# Patient Record
Sex: Male | Born: 1985
Health system: Southern US, Community
[De-identification: ages and names within clinical notes are randomized; demographics above are authoritative.]

## PROBLEM LIST (undated history)

## (undated) DIAGNOSIS — F32A Depression, unspecified: Secondary | ICD-10-CM

## (undated) DIAGNOSIS — T79A0XA Compartment syndrome, unspecified, initial encounter: Secondary | ICD-10-CM

## (undated) DIAGNOSIS — M9260 Juvenile osteochondrosis of tarsus, unspecified ankle: Secondary | ICD-10-CM

## (undated) DIAGNOSIS — L709 Acne, unspecified: Secondary | ICD-10-CM

## (undated) DIAGNOSIS — F329 Major depressive disorder, single episode, unspecified: Secondary | ICD-10-CM

## (undated) DIAGNOSIS — E039 Hypothyroidism, unspecified: Secondary | ICD-10-CM

## (undated) DIAGNOSIS — R369 Urethral discharge, unspecified: Secondary | ICD-10-CM

## (undated) HISTORY — DX: Acne, unspecified: L70.9

## (undated) HISTORY — DX: Compartment syndrome, unspecified, initial encounter: T79.A0XA

## (undated) HISTORY — PX: OTHER SURGICAL HISTORY: SHX169

## (undated) HISTORY — DX: Juvenile osteochondrosis of tarsus, unspecified ankle: M92.60

## (undated) HISTORY — PX: GROWTH PLATE SURGERY: SHX657

---
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
## 2006-01-03 ENCOUNTER — Ambulatory Visit: Payer: Self-pay | Admitting: Internal Medicine

## 2006-01-10 ENCOUNTER — Ambulatory Visit: Payer: Self-pay | Admitting: Internal Medicine

## 2007-02-11 DIAGNOSIS — J309 Allergic rhinitis, unspecified: Secondary | ICD-10-CM | POA: Insufficient documentation

## 2007-06-06 ENCOUNTER — Telehealth: Payer: Self-pay | Admitting: Internal Medicine

## 2007-12-31 ENCOUNTER — Encounter: Admission: RE | Admit: 2007-12-31 | Discharge: 2007-12-31 | Payer: Self-pay | Admitting: Sports Medicine

## 2008-01-06 ENCOUNTER — Encounter: Payer: Self-pay | Admitting: Internal Medicine

## 2008-01-07 ENCOUNTER — Ambulatory Visit (HOSPITAL_BASED_OUTPATIENT_CLINIC_OR_DEPARTMENT_OTHER): Admission: RE | Admit: 2008-01-07 | Discharge: 2008-01-07 | Payer: Self-pay | Admitting: Orthopedic Surgery

## 2008-01-20 ENCOUNTER — Encounter: Payer: Self-pay | Admitting: Internal Medicine

## 2008-01-26 ENCOUNTER — Emergency Department (HOSPITAL_COMMUNITY): Admission: EM | Admit: 2008-01-26 | Discharge: 2008-01-26 | Payer: Self-pay | Admitting: Emergency Medicine

## 2008-03-26 ENCOUNTER — Telehealth: Payer: Self-pay | Admitting: Internal Medicine

## 2008-03-27 ENCOUNTER — Ambulatory Visit: Payer: Self-pay | Admitting: Internal Medicine

## 2008-03-27 DIAGNOSIS — L708 Other acne: Secondary | ICD-10-CM

## 2008-03-27 DIAGNOSIS — J069 Acute upper respiratory infection, unspecified: Secondary | ICD-10-CM | POA: Insufficient documentation

## 2008-06-15 ENCOUNTER — Telehealth: Payer: Self-pay | Admitting: Internal Medicine

## 2008-06-26 DIAGNOSIS — T79A0XA Compartment syndrome, unspecified, initial encounter: Secondary | ICD-10-CM

## 2008-06-26 HISTORY — PX: OTHER SURGICAL HISTORY: SHX169

## 2008-06-26 HISTORY — DX: Compartment syndrome, unspecified, initial encounter: T79.A0XA

## 2008-07-06 ENCOUNTER — Telehealth: Payer: Self-pay | Admitting: Internal Medicine

## 2008-07-13 ENCOUNTER — Ambulatory Visit: Payer: Self-pay | Admitting: Internal Medicine

## 2008-09-28 ENCOUNTER — Telehealth: Payer: Self-pay | Admitting: *Deleted

## 2008-11-18 ENCOUNTER — Ambulatory Visit: Payer: Self-pay | Admitting: Internal Medicine

## 2008-11-18 DIAGNOSIS — R634 Abnormal weight loss: Secondary | ICD-10-CM | POA: Insufficient documentation

## 2008-11-20 ENCOUNTER — Encounter: Payer: Self-pay | Admitting: Internal Medicine

## 2008-11-26 ENCOUNTER — Telehealth: Payer: Self-pay | Admitting: Internal Medicine

## 2008-11-27 ENCOUNTER — Ambulatory Visit: Payer: Self-pay | Admitting: Internal Medicine

## 2008-11-27 LAB — CONVERTED CEMR LAB: Thyroperoxidase Ab SerPl-aCnc: 39.5 (ref 0.0–60.0)

## 2008-12-02 LAB — CONVERTED CEMR LAB
Free T4: 0.9 ng/dL (ref 0.6–1.6)
T3, Free: 2.8 pg/mL (ref 2.3–4.2)
TSH: 5.24 microintl units/mL (ref 0.35–5.50)

## 2009-06-26 HISTORY — PX: OTHER SURGICAL HISTORY: SHX169

## 2009-07-12 ENCOUNTER — Ambulatory Visit: Payer: Self-pay | Admitting: Internal Medicine

## 2009-07-12 DIAGNOSIS — L259 Unspecified contact dermatitis, unspecified cause: Secondary | ICD-10-CM | POA: Insufficient documentation

## 2009-09-01 ENCOUNTER — Ambulatory Visit: Payer: Self-pay | Admitting: Internal Medicine

## 2010-05-24 ENCOUNTER — Ambulatory Visit: Payer: Self-pay | Admitting: Internal Medicine

## 2010-05-24 DIAGNOSIS — F1021 Alcohol dependence, in remission: Secondary | ICD-10-CM

## 2010-05-24 DIAGNOSIS — F4320 Adjustment disorder, unspecified: Secondary | ICD-10-CM | POA: Insufficient documentation

## 2010-07-26 NOTE — Assessment & Plan Note (Signed)
Summary: acne//ccm rsc bmp/njr   Vital Signs:  Patient profile:   25 year old male Height:      73 inches Weight:      217 pounds BMI:     28.73 Pulse rate:   78 / minute BP sitting:   120 / 80  (right arm) Cuff size:   regular  Vitals Entered By: Romualdo Bolk, CMA (AAMA) (September 01, 2009 1:52 PM) CC: Discuss acne on back and around his mounth.    History of Present Illness: Patrick Hamilton comes in at spring break   for   acne . he states he is not concerned about other problems . Denies depression major injury  or major injury.   Acjne is on chin separate that other rash and some on upper back.     ? topicals in past ; No oral antibiotic for a while   Preventive Screening-Counseling & Management  Alcohol-Tobacco     Alcohol drinks/day: <1     Alcohol type: all     Feels need to cut down: no     Feels annoyed by complaints: no     Feels guilty re: drinking: no     Needs 'eye opener' in am: no     Smoking Status: never     Passive Smoke Exposure: yes  Caffeine-Diet-Exercise     Caffeine use/day: 0     Does Patient Exercise: yes     Type of exercise: wt's and cardio  Comments: past hx of  black out with alcohol in the past.    Hep-HIV-STD-Contraception     Dental Visit-last 6 months yes     Sun Exposure-Excessive: no  Safety-Violence-Falls     Seat Belt Use: yes     Firearms in the Home: no firearms in the home  Current Medications (verified): 1)  Fexofenadine Hcl 180 Mg Tabs (Fexofenadine Hcl) .Marland Kitchen.. 1 By Mouth Once Daily 2)  Desonide 0.05 % Oint (Desonide) .... Apply Two Times A Day As Needed  Allergies (verified): No Known Drug Allergies  Past History:  Past medical, surgical, family and social histories (including risk factors) reviewed, and no changes noted (except as noted below).  Past Medical History: Reviewed history from 03/27/2008 and no changes required. BW 7#6 oz  no neonatal problems Acne Allergic rhinitis Seviers disease  Past Surgical  History: Reviewed history from 03/27/2008 and no changes required. Nose Surgery Fx growth plate - R foot  Past History:  Care Management: Urgent Care in [place], [place] for Wrist  Family History: Reviewed history from 03/27/2008 and no changes required. Family History Diabetes 1st degree relative Family History of Learning Problems bro with autism Family History of Allergies  alcohol problems on moms side of family  Social History: Reviewed history from 07/12/2009 and no changes required. Single student  at [employer].    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   But has a meal plan.   1-2 meals per day.  sleep 7  ok.  Sun Exposure-Excessive:  no  Review of Systems  The patient denies anorexia, fever, weight loss, weight gain, vision loss, decreased hearing, hoarseness, chest pain, syncope, dyspnea on exertion, peripheral edema, prolonged cough, headaches, hemoptysis, abdominal pain, melena, hematochezia, severe indigestion/heartburn, hematuria, muscle weakness, difficulty walking, depression, unusual weight change, abnormal bleeding, enlarged lymph nodes, and angioedema.         sprained ankle     basket ball.     Physical Exam  General:  Well-developed,well-nourished,in  no acute distress; alert,appropriate and cooperative throughout examination Head:  normocephalic and atraumatic.   Eyes:  vision grossly intact.   Ears:  R ear normal, L ear normal, and no external deformities.   Nose:  no external deformity, no external erythema, and no nasal discharge.   Mouth:  pharynx pink and moist.  teeth in good repair Neck:  No deformities, masses, or tenderness noted. Lungs:  Normal respiratory effort, chest expands symmetrically. Lungs are clear to auscultation, no crackles or wheezes. Heart:  Normal rate and regular rhythm. S1 and S2 normal without gallop, murmur, click, rub or other extra sounds.no lifts.   Abdomen:  Bowel sounds positive,abdomen soft and non-tender without masses,  organomegaly or   noted. Msk:  no joint swelling, no joint warmth, and no redness over joints.   Pulses:  R and L carotid,radial,femoral,dorsalis pedis and posterior tibial pulses are full and equal bilaterally Extremities:  no clubbing cyanosis or edema  Neurologic:  alert & oriented X3, strength normal in all extremities, and gait normal.   Skin:  intact with   cystic acne      upper back  papules on lower face  mid face i sgood.  Cervical Nodes:  No lymphadenopathy noted Psych:  Oriented X3, good eye contact, not anxious appearing, and not depressed appearing.     Impression & Recommendations:  Problem # 1:  ACNE VULGARIS (ICD-706.1)  His updated medication list for this problem includes:    Doxycycline Hyclate 100 Mg Caps (Doxycycline hyclate) .Marland Kitchen... 1 by mouth two times a day for acne or as directed  Problem # 2:  counseling disc   counseled healthy lifestyle and minimizing alcohol risk.   Complete Medication List: 1)  Fexofenadine Hcl 180 Mg Tabs (Fexofenadine hcl) .Marland Kitchen.. 1 by mouth once daily 2)  Desonide 0.05 % Oint (Desonide) .... Apply two times a day as needed 3)  Doxycycline Hyclate 100 Mg Caps (Doxycycline hyclate) .Marland Kitchen.. 1 by mouth two times a day for acne or as directed  Patient Instructions: 1)  take the doxycycline  100 by mouth two times a day for at least a month to see how  helps the acne   2)  them may need to change .  to a topical .  3)  rov in 4-8 weeks or as needed.  Prescriptions: DOXYCYCLINE HYCLATE 100 MG CAPS (DOXYCYCLINE HYCLATE) 1 by mouth two times a day for acne or as directed  #60 x 2   Entered and Authorized by:   Madelin Headings MD   Signed by:   Madelin Headings MD on 09/01/2009   Method used:   Electronically to        [facility] [address] [phone]* (retail)       [address]       [place], [place]  [address]       Ph: [phone]       Fax: [phone]   RxID:   [id number]

## 2010-07-26 NOTE — Assessment & Plan Note (Signed)
Summary: rash on face and neck/cjr/pts mom rsc/cjr   Vital Signs:  Patient profile:   25 year old male Weight:      217 pounds Pulse rate:   78 / minute BP sitting:   110 / 70  (right arm) Cuff size:   large  Vitals Entered By: Romualdo Bolk, CMA (AAMA) (July 12, 2009 2:03 PM) CC: Rashes all over face and neck x 1 week- Pt states that he was taken benadryl  at night and using desonide ointment 0.05% bid as well whiched help. Pt states that the part around his eye itched real bad.   History of Present Illness: Patrick Hamilton comes in today because he had a facial rash as above.  He used GMs left over ointment  Using  two times a day desonide     on face for a rash around eye   and face minimal itching.   no internal eye signs .    Last used  yesterday.  No other treatment.   Much better now but would like rx.  ONly used for aweek but going back to campus. OTc med previously.   / if contact.  Allergy:  Needs refill  meds helping.  No cp sob.  Preventive Screening-Counseling & Management  Alcohol-Tobacco     Alcohol drinks/day: <1     Alcohol type: all     Smoking Status: never     Passive Smoke Exposure: yes  Caffeine-Diet-Exercise     Caffeine use/day: 0     Does Patient Exercise: yes     Type of exercise: wt's and cardio  Current Medications (verified): 1)  Fexofenadine Hcl 180 Mg Tabs (Fexofenadine Hcl) .Marland Kitchen.. 1 By Mouth Once Daily  Allergies (verified): No Known Drug Allergies  Past History:  Past medical, surgical, family and social histories (including risk factors) reviewed, and no changes noted (except as noted below).  Past Medical History: Reviewed history from 03/27/2008 and no changes required. BW 7#6 oz  no neonatal problems Acne Allergic rhinitis Seviers disease  Past Surgical History: Reviewed history from 03/27/2008 and no changes required. Nose Surgery Fx growth plate - R foot  Family History: Reviewed history from 03/27/2008 and no changes  required. Family History Diabetes 1st degree relative Family History of Learning Problems bro with autism Family History of Allergies  Social History: Reviewed history from 11/18/2008 and no changes required. Single student  at [employer].    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   But has a meal plan.   1-2 meals per day.   Review of Systems  The patient denies anorexia, fever, chest pain, syncope, dyspnea on exertion, prolonged cough, abdominal pain, abnormal bleeding, enlarged lymph nodes, and angioedema.    Physical Exam  General:  Well-developed,well-nourished,in no acute distress; alert,appropriate and cooperative throughout examination Head:  normocephalic and atraumatic.   Eyes:  vision grossly intact, pupils equal, and pupils round.   no redness  Neck:  No deformities, masses, or tenderness noted. Lungs:  Normal respiratory effort, chest expands symmetrically. Lungs are clear to auscultation, no crackles or wheezes. Heart:  Normal rate and regular rhythm. S1 and S2 normal without gallop, murmur, click, rub or other extra sounds. Pulses:  pulses intact without delay   Skin:  turgor normal, color normal, no ecchymoses, and no petechiae.   minimal acne on chin and around mouth.    Cervical Nodes:  No lymphadenopathy noted   Impression & Recommendations:  Problem # 1:  DERMATITIS (  ICD-692.9)  poss  contact .Marland Kitchen.. currently better.      Cautioned about steroid use on face and avoid rebound.   His updated medication list for this problem includes:    Fexofenadine Hcl 180 Mg Tabs (Fexofenadine hcl) .Marland Kitchen... 1 by mouth once daily    Desonide 0.05 % Oint (Desonide) .Marland Kitchen... Apply two times a day as needed  Orders: Prescription Created Electronically [phone])  Problem # 2:  ALLERGIC RHINITIS (ICD-477.9) Assessment: Unchanged  His updated medication list for this problem includes:    Fexofenadine Hcl 180 Mg Tabs (Fexofenadine hcl) .Marland Kitchen... 1 by mouth once daily  Discussed use of  allergy medications and environmental measures.   Orders: Prescription Created Electronically [id number])  Complete Medication List: 1)  Fexofenadine Hcl 180 Mg Tabs (Fexofenadine hcl) .Marland Kitchen.. 1 by mouth once daily 2)  Desonide 0.05 % Oint (Desonide) .... Apply two times a day as needed  Other Orders: Admin 1st Vaccine (14782) Flu Vaccine 46yrs + (95621)  Patient Instructions: 1)  use cortisone creams sparingly on your face.    and not in your eye. 2)  if recurring  call   about advice . Prescriptions: DESONIDE 0.05 % OINT (DESONIDE) apply two times a day as needed  #15 gram x 0   Entered and Authorized by:   Madelin Headings MD   Signed by:   Madelin Headings MD on 07/12/2009   Method used:   Electronically to        [facility] [address] [phone]* (retail)       [address]       [place], [place]  [address]       Ph: [phone]       Fax: [phone]   RxID:   [id number] FEXOFENADINE HCL 180 MG TABS (FEXOFENADINE HCL) 1 by mouth once daily  #90 x 3   Entered by:   Romualdo Bolk, CMA (AAMA)   Authorized by:   Madelin Headings MD   Signed by:   Romualdo Bolk, CMA (AAMA) on 07/12/2009   Method used:   Electronically to        [facility]* (mail-order)             ,          Ph: [phone]       Fax: [phone]   RxID:   [id number]   Prevention & Chronic Care Immunizations   Influenza vaccine: Fluvax 3+  (07/12/2009)    Tetanus booster: 03/26/2002: Historical    Pneumococcal vaccine: Not documented  Other Screening   Smoking status: never  (07/12/2009)           Flu Vaccine Consent Questions     Do you have a history of severe allergic reactions to this vaccine? no    Any prior history of allergic reactions to egg and/or gelatin? no    Do you have a sensitivity to the preservative Thimersol? no    Do you have a past history of Guillan-Barre Syndrome? no    Do you currently have an acute febrile illness? no    Have you ever had a severe  reaction to latex? no    Vaccine information given and explained to patient? yes    Are you currently pregnant? no    Lot Number:[id number]   Exp Date:12/23/2009   Site Given  Left Deltoid IM Romualdo Bolk, CMA (AAMA)  July 12, 2009 2:08 PM

## 2010-07-26 NOTE — Assessment & Plan Note (Signed)
Summary: acne/cjr pt rsc/njr   Vital Signs:  Patient profile:   25 year old male Weight:      216 pounds Pulse rate:   88 / minute BP sitting:   130 / 80  (left arm) Cuff size:   large  Vitals Entered By: Romualdo Bolk, CMA (AAMA) (May 24, 2010 3:03 PM) CC: Pt is here to discuss alcohol abuse- Pt states that it effects his brain chemistry and he becomes distructive. Last drink was 9/14. He had 5 drinks that day. Prior to that he druck enought to pass out.   History of Present Illness: Patrick Hamilton comes in today  for scheduled appointment for follow-up acne that he states is doing well. and not worrisome to him. at present. However he is more concerned in wishes to speak about an alcohol problem.  he drinks mostly socially what most recently would drink large amounts to the point of passing out. This had caused him waking up in the hospital in August with IVs. He also was charged with a misdemeanor that he doesn't remember doing with law enforcement.  His court date was October 4 and it was dismissed.  He has not had alcohol since September and doesn't plan on this realizing he has a problem. He does tend to have what he calls binge eating and wanting to over eat.  No self-induced vomiting or drugs are mind altering substances.  Since he has stopped alcohol he has been somewhat reclusive gone back to studies and trying to exercise.   However some of his grades have not been as well as he had many absences from class. He asks today for help documenting this with his professors to see if he can have some attendance forgiveness. he currently does not think he is depressed or suicidal although had depressive symptoms in the month of September. His father was recently diagnosed with prostate cancer and had surgery which was stressful for him but he is doing better now.  Preventive Screening-Counseling & Management  Alcohol-Tobacco     Alcohol drinks/day: <1     Alcohol type: all  Feels need to cut down: no     Feels annoyed by complaints: no     Feels guilty re: drinking: no     Needs 'eye opener' in am: no     Smoking Status: never     Passive Smoke Exposure: yes  Caffeine-Diet-Exercise     Caffeine use/day: 0     Does Patient Exercise: yes     Type of exercise: wt's and cardio  Current Medications (verified): 1)  Fexofenadine Hcl 180 Mg Tabs (Fexofenadine Hcl) .Marland Kitchen.. 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   But has a meal plan.   1-2 meals per day.  sleep 7  ok.  living by self.   Review of Systems  The patient denies anorexia, fever, weight loss,  weight gain, vision loss, decreased hearing, chest pain, peripheral edema, prolonged cough, abdominal pain, melena, hematochezia, severe indigestion/heartburn, hematuria, transient blindness, abnormal bleeding, enlarged lymph nodes, and angioedema.    Physical Exam  General:  Well-developed,well-nourished,in no acute distress; alert,appropriate and cooperative throughout examination Head:  normocephalic, atraumatic, and no abnormalities observed.   Eyes:  vision grossly intact, pupils equal, and pupils round.   Skin:  no active acne on face  Psych:  Oriented X3 and good eye contact.  ocass tearful speaking of fathers situation  midly down but articulate and nl speech  and affect  otherwies   Impression & Recommendations:  Problem # 1:  ADJUSTMENT DISORDER (ICD-309.9) seems to be stable at this point.  discussed at length that I think that counseling would be quite helpful he is a private person and confidential appropriate counseling including developmental issues father's illness and substances would be quite helpful for him to continue in a healthy manner. The president would suggest using the college counseling center and also have them give him advice about approaching his professors about his attendance absences. If there's specific supportive letters that would help he can call for this.  it is unclear if he has some kind of eating disorder when he talks about binge eating. Counseling could address this also.  Problem # 2:  ALCOHOL ABUSE, HX OF (ICD-V11.3) history of use causing physical and legal difficulties currently better agree with abstinence counseling.    Complete Medication List: 1)  Fexofenadine Hcl 180 Mg Tabs (Fexofenadine hcl) .Marland Kitchen.. 1 by mouth once daily  Patient Instructions: 1)  remain alcohol free. Seek counseling help through his school counseling center discussion about expectations. Follow-up with me if needed for local help.   Orders Added: 1)  Est. Patient Level IV [45409]

## 2010-11-03 ENCOUNTER — Telehealth: Payer: Self-pay | Admitting: *Deleted

## 2010-11-03 ENCOUNTER — Ambulatory Visit (HOSPITAL_COMMUNITY)
Admission: RE | Admit: 2010-11-03 | Discharge: 2010-11-03 | Disposition: A | Discharge status: Home / Self Care | Payer: BC Managed Care – PPO | Attending: Psychiatry | Admitting: Psychiatry

## 2010-11-03 NOTE — Telephone Encounter (Signed)
Mom called saying that pt is having suciadal thoughts and is very depressed. Mom is very anxious and worried. I told mom to take him to [facility] to get evaluated. Then call us in the am to let us know how things are going and if we need to do anything.

## 2010-11-03 NOTE — Telephone Encounter (Signed)
Agree with  Emergent evaluation

## 2010-11-04 ENCOUNTER — Telehealth: Payer: Self-pay | Admitting: *Deleted

## 2010-11-04 NOTE — Telephone Encounter (Signed)
Spoke to mom they did an intake on him. They are going to get him to an intensive out patient treatment as soon as their is a opening. They talked to him for about 1 hour. They made him sign an agreement not to hurt himself. He is hoping to get started on Monday with treatment. Pt feels very good about this and is willing to get help.

## 2010-11-08 ENCOUNTER — Other Ambulatory Visit (HOSPITAL_COMMUNITY): Payer: BC Managed Care – PPO | Admitting: Psychiatry

## 2010-11-08 NOTE — Op Note (Signed)
NAME:  Patrick Hamilton, Patrick Hamilton NO.:  [id number]   MEDICAL RECORD NO.:  [id number]          PATIENT TYPE:  AMB   LOCATION:  DSC                          FACILITY:  [facility]   PHYSICIAN:  Eulas Post, MD    DATE OF BIRTH:  June 03, 1986   DATE OF PROCEDURE:  DATE OF DISCHARGE:                               OPERATIVE REPORT   PREOPERATIVE DIAGNOSIS:  Right exercise-induced compartment syndrome.   POSTOPERATIVE DIAGNOSIS:  Right exercise-induced compartment syndrome.   OPERATIVE PROCEDURE:  Right lateral and anterior compartment fasciotomy.   ANESTHESIA:  General.   ESTIMATED BLOOD LOSS:  Minimal.   TOURNIQUET TIME:  Twenty two minutes.   PREOPERATIVE INDICATIONS:  Mr. 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  He had  compartment pressures measured at our office, which were at rest as high  as 50 or 60 and with exercise as high as 150.  He elected to undergo the  above-named procedures.  The risks, benefits, and alternatives including  not limited to risks of infection, bleeding, nerve injury, recurrent  compartment syndrome, cardiopulmonary complications, among others were  discussed and he was willing to proceed.   OPERATIVE PROCEDURE:  The patient was brought to the operating room and  placed in supine position.  Intravenous Ancef 1 gm was given.  General  anesthesia was administered.  The right lower extremity was prepped and  draped in the usual sterile fashion.  Incision was made at the midway  point between the tibial crest and fibula.  The intermuscular septum was  identified and the superficial peroneal nerve identified and protected.  We then performed a fasciotomy both proximally and distally of both the  anterior and lateral compartments.  We then irrigated  the wounds  copiously and the compartments were then much softer.  We then closed  the subcutaneous tissue with 3-0 Vicryl followed by 4-0 Monocryl.  Steri-  Strips were placed.  Marcaine was injected.  Sterile gauze was applied.  The patient was awakened and returned to the PACU in stable satisfactory  condition.  There were no complications.  The patient tolerated the  procedure well.      Eulas Post, MD  Electronically Signed     JPL/MEDQ  D:  01/07/2008  T:  01/08/2008  Job:  [id number]

## 2010-11-09 ENCOUNTER — Other Ambulatory Visit (HOSPITAL_COMMUNITY): Payer: BC Managed Care – PPO | Attending: Psychiatry | Admitting: Psychiatry

## 2010-11-09 DIAGNOSIS — F3289 Other specified depressive episodes: Secondary | ICD-10-CM | POA: Insufficient documentation

## 2010-11-09 DIAGNOSIS — F329 Major depressive disorder, single episode, unspecified: Secondary | ICD-10-CM | POA: Insufficient documentation

## 2010-11-10 ENCOUNTER — Other Ambulatory Visit (HOSPITAL_BASED_OUTPATIENT_CLINIC_OR_DEPARTMENT_OTHER): Payer: BC Managed Care – PPO | Admitting: Psychiatry

## 2010-11-10 DIAGNOSIS — F411 Generalized anxiety disorder: Secondary | ICD-10-CM

## 2010-11-11 ENCOUNTER — Other Ambulatory Visit (HOSPITAL_COMMUNITY): Payer: BC Managed Care – PPO | Admitting: Psychiatry

## 2010-11-14 ENCOUNTER — Other Ambulatory Visit (HOSPITAL_COMMUNITY): Payer: BC Managed Care – PPO | Admitting: Psychiatry

## 2010-11-15 ENCOUNTER — Other Ambulatory Visit (HOSPITAL_COMMUNITY): Payer: BC Managed Care – PPO | Admitting: Psychiatry

## 2010-11-16 ENCOUNTER — Other Ambulatory Visit (HOSPITAL_COMMUNITY): Payer: BC Managed Care – PPO | Admitting: Psychiatry

## 2010-11-17 ENCOUNTER — Other Ambulatory Visit (HOSPITAL_COMMUNITY): Payer: BC Managed Care – PPO | Admitting: Psychiatry

## 2010-11-18 ENCOUNTER — Other Ambulatory Visit (HOSPITAL_COMMUNITY): Payer: BC Managed Care – PPO | Admitting: Psychiatry

## 2010-11-22 ENCOUNTER — Other Ambulatory Visit (HOSPITAL_COMMUNITY): Payer: BC Managed Care – PPO | Attending: Psychiatry | Admitting: Psychiatry

## 2010-11-22 ENCOUNTER — Other Ambulatory Visit (HOSPITAL_COMMUNITY): Payer: BC Managed Care – PPO | Admitting: Psychiatry

## 2010-11-22 DIAGNOSIS — F329 Major depressive disorder, single episode, unspecified: Secondary | ICD-10-CM | POA: Insufficient documentation

## 2010-11-22 DIAGNOSIS — F3289 Other specified depressive episodes: Secondary | ICD-10-CM | POA: Insufficient documentation

## 2010-11-23 ENCOUNTER — Other Ambulatory Visit (HOSPITAL_COMMUNITY): Payer: BC Managed Care – PPO | Admitting: Psychiatry

## 2010-11-24 ENCOUNTER — Other Ambulatory Visit (HOSPITAL_COMMUNITY): Payer: BC Managed Care – PPO | Admitting: Psychiatry

## 2010-11-25 ENCOUNTER — Other Ambulatory Visit (HOSPITAL_COMMUNITY): Payer: BC Managed Care – PPO | Admitting: Psychiatry

## 2010-11-28 ENCOUNTER — Other Ambulatory Visit (HOSPITAL_COMMUNITY): Payer: BC Managed Care – PPO | Admitting: Psychiatry

## 2010-12-05 ENCOUNTER — Ambulatory Visit (HOSPITAL_COMMUNITY): Payer: BC Managed Care – PPO | Admitting: Marriage and Family Therapist

## 2010-12-05 DIAGNOSIS — F411 Generalized anxiety disorder: Secondary | ICD-10-CM

## 2010-12-05 DIAGNOSIS — F329 Major depressive disorder, single episode, unspecified: Secondary | ICD-10-CM

## 2010-12-08 ENCOUNTER — Ambulatory Visit (HOSPITAL_COMMUNITY): Payer: BC Managed Care – PPO | Admitting: Physician Assistant

## 2010-12-08 DIAGNOSIS — F410 Panic disorder [episodic paroxysmal anxiety] without agoraphobia: Secondary | ICD-10-CM

## 2010-12-08 DIAGNOSIS — F329 Major depressive disorder, single episode, unspecified: Secondary | ICD-10-CM

## 2010-12-12 ENCOUNTER — Encounter (HOSPITAL_BASED_OUTPATIENT_CLINIC_OR_DEPARTMENT_OTHER): Payer: BC Managed Care – PPO | Admitting: Marriage and Family Therapist

## 2010-12-12 DIAGNOSIS — F339 Major depressive disorder, recurrent, unspecified: Secondary | ICD-10-CM

## 2010-12-20 ENCOUNTER — Encounter (HOSPITAL_COMMUNITY): Payer: BC Managed Care – PPO | Admitting: Marriage and Family Therapist

## 2010-12-20 DIAGNOSIS — F329 Major depressive disorder, single episode, unspecified: Secondary | ICD-10-CM

## 2010-12-20 DIAGNOSIS — F411 Generalized anxiety disorder: Secondary | ICD-10-CM

## 2011-01-03 ENCOUNTER — Encounter (HOSPITAL_BASED_OUTPATIENT_CLINIC_OR_DEPARTMENT_OTHER): Payer: BC Managed Care – PPO | Admitting: Marriage and Family Therapist

## 2011-01-03 DIAGNOSIS — F411 Generalized anxiety disorder: Secondary | ICD-10-CM

## 2011-01-03 DIAGNOSIS — F329 Major depressive disorder, single episode, unspecified: Secondary | ICD-10-CM

## 2011-01-04 ENCOUNTER — Encounter (HOSPITAL_COMMUNITY): Payer: BC Managed Care – PPO | Admitting: Physician Assistant

## 2011-01-04 DIAGNOSIS — F411 Generalized anxiety disorder: Secondary | ICD-10-CM

## 2011-01-04 DIAGNOSIS — F329 Major depressive disorder, single episode, unspecified: Secondary | ICD-10-CM

## 2011-01-10 ENCOUNTER — Encounter (HOSPITAL_BASED_OUTPATIENT_CLINIC_OR_DEPARTMENT_OTHER): Payer: BC Managed Care – PPO | Admitting: Marriage and Family Therapist

## 2011-01-10 DIAGNOSIS — F339 Major depressive disorder, recurrent, unspecified: Secondary | ICD-10-CM

## 2011-01-17 ENCOUNTER — Encounter (HOSPITAL_BASED_OUTPATIENT_CLINIC_OR_DEPARTMENT_OTHER): Payer: BC Managed Care – PPO | Admitting: Marriage and Family Therapist

## 2011-01-17 DIAGNOSIS — F339 Major depressive disorder, recurrent, unspecified: Secondary | ICD-10-CM

## 2011-01-24 ENCOUNTER — Encounter (HOSPITAL_COMMUNITY): Payer: BC Managed Care – PPO | Admitting: Physician Assistant

## 2011-01-24 ENCOUNTER — Encounter (HOSPITAL_COMMUNITY): Payer: BC Managed Care – PPO | Admitting: Marriage and Family Therapist

## 2011-01-26 ENCOUNTER — Encounter (HOSPITAL_BASED_OUTPATIENT_CLINIC_OR_DEPARTMENT_OTHER): Payer: BC Managed Care – PPO | Admitting: Marriage and Family Therapist

## 2011-01-26 DIAGNOSIS — F411 Generalized anxiety disorder: Secondary | ICD-10-CM

## 2011-02-02 ENCOUNTER — Encounter (HOSPITAL_COMMUNITY): Payer: BC Managed Care – PPO | Admitting: Marriage and Family Therapist

## 2011-02-06 ENCOUNTER — Encounter (HOSPITAL_BASED_OUTPATIENT_CLINIC_OR_DEPARTMENT_OTHER): Payer: BC Managed Care – PPO | Admitting: Marriage and Family Therapist

## 2011-02-06 DIAGNOSIS — F339 Major depressive disorder, recurrent, unspecified: Secondary | ICD-10-CM

## 2011-02-13 ENCOUNTER — Encounter (HOSPITAL_COMMUNITY): Payer: BC Managed Care – PPO | Admitting: Marriage and Family Therapist

## 2011-03-23 LAB — POCT HEMOGLOBIN-HEMACUE: Hemoglobin: 15.4

## 2011-03-30 ENCOUNTER — Encounter (INDEPENDENT_AMBULATORY_CARE_PROVIDER_SITE_OTHER): Payer: BC Managed Care – PPO | Admitting: Physician Assistant

## 2011-03-30 DIAGNOSIS — F411 Generalized anxiety disorder: Secondary | ICD-10-CM

## 2011-03-30 DIAGNOSIS — F329 Major depressive disorder, single episode, unspecified: Secondary | ICD-10-CM

## 2011-07-04 ENCOUNTER — Encounter (HOSPITAL_COMMUNITY): Payer: BC Managed Care – PPO | Admitting: Physician Assistant

## 2011-07-21 ENCOUNTER — Encounter: Payer: Self-pay | Admitting: Family Medicine

## 2011-08-12 ENCOUNTER — Ambulatory Visit: Payer: Self-pay | Admitting: Physician Assistant

## 2011-08-12 ENCOUNTER — Other Ambulatory Visit: Payer: Self-pay | Admitting: Physician Assistant

## 2011-08-12 ENCOUNTER — Encounter: Payer: Self-pay | Admitting: Physician Assistant

## 2011-08-12 VITALS — BP 116/75 | HR 57 | Temp 98.0°F | Resp 16 | Ht 75.0 in | Wt 211.0 lb

## 2011-08-12 DIAGNOSIS — E039 Hypothyroidism, unspecified: Secondary | ICD-10-CM

## 2011-08-12 DIAGNOSIS — Z008 Encounter for other general examination: Secondary | ICD-10-CM

## 2011-08-12 DIAGNOSIS — Z0000 Encounter for general adult medical examination without abnormal findings: Secondary | ICD-10-CM

## 2011-08-12 LAB — COMPREHENSIVE METABOLIC PANEL
ALT: 19 U/L (ref 0–53)
CO2: 28 mEq/L (ref 19–32)
Calcium: 9.2 mg/dL (ref 8.4–10.5)
Chloride: 103 mEq/L (ref 96–112)
Creat: 1.17 mg/dL (ref 0.50–1.35)
Glucose, Bld: 96 mg/dL (ref 70–99)

## 2011-08-12 LAB — POCT CBC
HCT, POC: 45.2 % (ref 43.5–53.7)
Hemoglobin: 14.2 g/dL (ref 14.1–18.1)
Lymph, poc: 1.5 (ref 0.6–3.4)
MCH, POC: 30 pg (ref 27–31.2)
MCHC: 31.4 g/dL — AB (ref 31.8–35.4)
MCV: 95.4 fL (ref 80–97)
MPV: 11.4 fL (ref 0–99.8)
POC MID %: 6.4 %M (ref 0–12)
RBC: 4.74 M/uL (ref 4.69–6.13)
WBC: 3.6 10*3/uL — AB (ref 4.6–10.2)

## 2011-08-12 LAB — POCT URINALYSIS DIPSTICK
Blood, UA: NEGATIVE
Nitrite, UA: NEGATIVE
Protein, UA: NEGATIVE
Spec Grav, UA: 1.02
Urobilinogen, UA: 0.2

## 2011-08-12 LAB — POCT UA - MICROSCOPIC ONLY
Crystals, Ur, HPF, POC: NEGATIVE
Epithelial cells, urine per micros: NEGATIVE
WBC, Ur, HPF, POC: NEGATIVE
Yeast, UA: NEGATIVE

## 2011-08-12 LAB — TSH: TSH: 5.419 u[IU]/mL — ABNORMAL HIGH (ref 0.350–4.500)

## 2011-08-12 LAB — PSA: PSA: 0.97 ng/mL (ref <=4.00)

## 2011-08-12 NOTE — Progress Notes (Signed)
Patient ID: Patrick Hamilton MRN: [id number], DOB: 1986-04-01 26 y.o. Date of Encounter: 08/12/2011, 10:48 AM  Primary Physician: Lorretta Harp, MD, MD  Chief Complaint: DOT Physical  HPI: 26 y.o. y/o male with history noted below here for CPE/DOT physical. Doing well. No issues/complaints. 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 No chronic illnesses.    Review of Systems: Consitutional: No fever, chills, fatigue, night sweats, lymphadenopathy, or weight changes. Eyes: No visual changes, eye redness, or discharge. ENT/Mouth: Ears: No otalgia, tinnitus, hearing loss, discharge. Nose: No congestion, rhinorrhea, sinus pain, or epistaxis. Throat: No sore throat, post nasal drip, or teeth pain. Cardiovascular: No CP, palpitations, diaphoresis, DOE, edema, orthopnea, PND. Respiratory: No cough, hemoptysis, SOB, or wheezing. Gastrointestinal: No anorexia, dysphagia, reflux, pain, nausea, vomiting, hematemesis, diarrhea, constipation, BRBPR, or melena. Genitourinary: No dysuria, frequency, urgency, hematuria, incontinence, nocturia, decreased urinary stream, discharge, impotence, or testicular pain/masses. Musculoskeletal: No decreased ROM, myalgias, stiffness, joint swelling, or weakness. Skin: No rash, erythema, lesion changes, pain, warmth, jaundice, or pruritis. Neurological: No headache, dizziness, syncope, seizures, tremors, memory loss, coordination problems, or paresthesias. Psychological: No anxiety, depression, hallucinations, SI/HI. Endocrine: No fatigue, polydipsia, polyphagia, polyuria, or known diabetes. All other systems were reviewed and are otherwise negative.  Past Medical History  Diagnosis Date  . Compartment syndrome 2010    right leg     Past Surgical History  Procedure Date  . Tonsillectomy and adenoidectomy 2003  . Rhinoplasty 1999  . Compartment syndrome release 2010    Home Meds:  Prior to Admission medications   Not  on File    Allergies: No Known Allergies  History   Social History  . Marital Status: Single    Spouse Name: N/A    Number of Children: N/A  . Years of Education: N/A   Occupational History  . Not on file.   Social History Main Topics  . Smoking status: Never Smoker   . Smokeless tobacco: Not on file  . Alcohol Use: No  . Drug Use: No  . Sexually Active: Not on file   Other Topics Concern  . Not on file   Social History Narrative  . No narrative on file    Family History  Problem Relation Age of Onset  . Prostate cancer Father 29    Physical Exam: Blood pressure 116/75, pulse 57, temperature 98 F (36.7 C), temperature source Oral, resp. rate 16, height 6\' 3"  (1.905 m), weight 211 lb (95.709 kg).  General: Well developed, well nourished, in no acute distress. HEENT: Normocephalic, atraumatic. Conjunctiva pink, sclera non-icteric. Pupils 2 mm constricting to 1 mm, round, regular, and equally reactive to light and accomodation. EOMI. Internal auditory canal clear. TMs with good cone of light and without pathology. Nasal mucosa pink. Nares are without discharge. No sinus tenderness. Oral mucosa pink. Dentition normal. Pharynx without exudate.   Neck: Supple. Trachea midline. No thyromegaly. Full ROM. No lymphadenopathy. Lungs: Clear to auscultation bilaterally without wheezes, rales, or rhonchi. Breathing is of normal effort and unlabored. Cardiovascular: RRR with S1 S2. No murmurs, rubs, or gallops appreciated. Distal pulses 2+ symmetrically. No carotid or abdominal bruits. Abdomen: Soft, non-tender, non-distended with normoactive bowel sounds. No hepatosplenomegaly or masses. No rebound/guarding. No CVA tenderness. Without hernias.  Genitourinary: Circumcised male. No penile lesions. Testes descended bilaterally, and smooth without tenderness or masses.  Musculoskeletal: Full range of motion and 5/5 strength throughout. Without swelling, atrophy, tenderness, crepitus, or  warmth. Extremities without clubbing, cyanosis, or  edema. Calves supple. Skin: Warm and moist without erythema, ecchymosis, wounds, or rash. Neuro: A+Ox3. CN II-XII grossly intact. Moves all extremities spontaneously. Full sensation throughout. Normal gait. DTR 2+ throughout upper and lower extremities. Finger to nose intact. Psych:  Responds to questions appropriately with a normal affect.   Studies: CMET, PSA, TSH,  all pending. Patient is nonfasting Results for orders placed in visit on 08/12/11  POCT CBC      Component Value Range   WBC 3.6 (*) 4.6 - 10.2 (K/uL)   Lymph, poc 1.5  0.6 - 3.4    POC LYMPH PERCENT 41.8  10 - 50 (%L)   MID (cbc) 0.2  0 - 0.9    POC MID % 6.4  0 - 12 (%M)   POC Granulocyte 1.9 (*) 2 - 6.9    Granulocyte percent 51.8  37 - 80 (%G)   RBC 4.74  4.69 - 6.13 (M/uL)   Hemoglobin 14.2  14.1 - 18.1 (g/dL)   HCT, POC 40.9  81.1 - 53.7 (%)   MCV 95.4  80 - 97 (fL)   MCH, POC 30.0  27 - 31.2 (pg)   MCHC 31.4 (*) 31.8 - 35.4 (g/dL)   RDW, POC 91.4     Platelet Count, POC 150  142 - 424 (K/uL)   MPV 11.4  0 - 99.8 (fL)  POCT URINALYSIS DIPSTICK      Component Value Range   Color, UA yellow     Clarity, UA clear     Glucose, UA neg     Bilirubin, UA neg     Ketones, UA neg     Spec Grav, UA 1.020     Blood, UA neg     pH, UA 7.0     Protein, UA neg     Urobilinogen, UA 0.2     Nitrite, UA neg     Leukocytes, UA Negative    POCT UA - MICROSCOPIC ONLY      Component Value Range   WBC, Ur, HPF, POC neg     RBC, urine, microscopic neg     Bacteria, U Microscopic neg     Mucus, UA neg     Epithelial cells, urine per micros neg     Crystals, Ur, HPF, POC neg     Casts, Ur, LPF, POC neg     Yeast, UA neg       Assessment/Plan:  26 y.o. y/o Caucasian male here for CPE/DOT physical -Cleared -Form completed -Await labs, check baseline PSA today secondary to first degree relative with prostate cancer -Healthy diet and exercise -Anticipatory guidance    -RTC prn  Signed, Eula Listen, PA-C 08/12/2011 10:48 AM

## 2011-08-12 NOTE — Progress Notes (Signed)
Addended by: Sondra Barges on: 08/12/2011 05:42 PM   Modules accepted: Orders

## 2011-08-13 LAB — T4, FREE: Free T4: 1.02 ng/dL (ref 0.80–1.80)

## 2011-08-14 ENCOUNTER — Telehealth: Payer: Self-pay

## 2011-08-14 NOTE — Telephone Encounter (Signed)
Pt would like to pick up a copy of his blood work he states he will come by to pick up in the am 08-15-11

## 2011-08-15 NOTE — Telephone Encounter (Signed)
Records are ready for pick up per patient's request

## 2011-08-16 ENCOUNTER — Encounter: Payer: Self-pay | Admitting: Internal Medicine

## 2011-08-17 ENCOUNTER — Encounter: Payer: Self-pay | Admitting: Internal Medicine

## 2011-08-17 ENCOUNTER — Other Ambulatory Visit: Payer: Self-pay | Admitting: Internal Medicine

## 2011-08-17 ENCOUNTER — Ambulatory Visit (INDEPENDENT_AMBULATORY_CARE_PROVIDER_SITE_OTHER): Payer: BC Managed Care – PPO | Admitting: Internal Medicine

## 2011-08-17 DIAGNOSIS — E038 Other specified hypothyroidism: Secondary | ICD-10-CM | POA: Insufficient documentation

## 2011-08-17 DIAGNOSIS — Z23 Encounter for immunization: Secondary | ICD-10-CM

## 2011-08-17 DIAGNOSIS — E039 Hypothyroidism, unspecified: Secondary | ICD-10-CM

## 2011-08-17 DIAGNOSIS — M546 Pain in thoracic spine: Secondary | ICD-10-CM

## 2011-08-17 DIAGNOSIS — R946 Abnormal results of thyroid function studies: Secondary | ICD-10-CM | POA: Insufficient documentation

## 2011-08-17 MED ORDER — ETODOLAC 400 MG PO TABS: 400.0000 mg | ORAL_TABLET | Freq: Two times a day (BID) | ORAL | Status: DC

## 2011-08-17 NOTE — Patient Instructions (Signed)
Plan repeat  thryoid studies to include TSH  Free t4 and free t3 and antibody thyroid tests in about 4-6 months or if needed.

## 2011-08-17 NOTE — Progress Notes (Signed)
  Subjective:    Patient ID: Patrick Hamilton, male    DOB: 1986-02-06, 26 y.o.   MRN: [id number]  HPI Pt comes in to discuss abnormal thyroid tests done in the context of a dot check at urgent care . 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  Worried about having to take meds reg  For thyroid Worries about parents but doing much better etoh free currently  Less depressive sx .  No supplements no etoh.   Had back pain mid upper  and seen in [place]  given voltaren that helped . Some reflare but not sig at this time  Asks for refill of meds.     Review of Systems Neg cp sob GIG Uchanges .  Some dry skin some constipation on fiber .   Mood issues ? If could be related to thyroid.  fam hx of thyroid disease in Aunt GM and a cousin.   Past history family history social history reviewed in the electronic medical record. Reviewed  Urgent care visit and labs     Objective:   Physical Exam wdwn in nad  midly anxious  Looks well  Good eye contact .  heent grossly normal Neck : thyroid palpable no  Nodules of tendernes n oadenopathy Chest:  Clear to A&P without wheezes rales or rhonchi CV:  S1-S2 no gallops or murmurs peripheral perfusion is normal Oriented x 3. Normal cognition, attention, speech. Not depressed appearing   Good eye contact . Neuro non focal .     Lab Results  Component Value Date   TSH 5.419* 08/12/2011   T3TOTAL 88.8 08/12/2011   Free t4 1.02  Lab Results  Component Value Date   WBC 3.6* 08/12/2011   HGB 14.2 08/12/2011   HCT 45.2 08/12/2011   GLUCOSE 96 08/12/2011   ALT 19 08/12/2011   AST 23 08/12/2011   NA 138 08/12/2011   K 4.4 08/12/2011   CL 103 08/12/2011   CREATININE 1.17 08/12/2011   BUN 14 08/12/2011   CO2 28 08/12/2011   TSH 5.419* 08/12/2011   PSA 0.97 08/12/2011        Assessment & Plan:  Abnormal tsh    Poss subclinical hypothyroid  Doubt sx  We discussed caused by thyroid disease. Needs to be  followed as agreed  .  tsh uln 2 years ago when labs done and neg aby and nl free t4 Repeat labs in 4-6 months at the latest.  tsh free t4 free t3 and aby.  Offered endo consult if he wishes but at this time doubt any intervention bu follow as we discussed . Reassured about the condition .  Back pain ms cause ok now   Disc back hygiene and care   Refill med for now gi precautions.  Counseled.   Expectant management. Anxiety worry  . Continue lifestyle intervention healthy eating and exercise . Good luck with new job.

## 2011-08-19 ENCOUNTER — Encounter: Payer: Self-pay | Admitting: Internal Medicine

## 2011-08-19 DIAGNOSIS — M546 Pain in thoracic spine: Secondary | ICD-10-CM | POA: Insufficient documentation

## 2012-01-08 ENCOUNTER — Other Ambulatory Visit (INDEPENDENT_AMBULATORY_CARE_PROVIDER_SITE_OTHER): Payer: BC Managed Care – PPO

## 2012-01-08 DIAGNOSIS — R7989 Other specified abnormal findings of blood chemistry: Secondary | ICD-10-CM

## 2012-01-08 DIAGNOSIS — R946 Abnormal results of thyroid function studies: Secondary | ICD-10-CM

## 2012-01-15 ENCOUNTER — Ambulatory Visit (INDEPENDENT_AMBULATORY_CARE_PROVIDER_SITE_OTHER): Payer: BC Managed Care – PPO | Admitting: Internal Medicine

## 2012-01-15 ENCOUNTER — Encounter: Payer: Self-pay | Admitting: Internal Medicine

## 2012-01-15 VITALS — BP 112/68 | HR 95 | Temp 98.1°F | Wt 217.0 lb

## 2012-01-15 DIAGNOSIS — B079 Viral wart, unspecified: Secondary | ICD-10-CM

## 2012-01-15 DIAGNOSIS — E039 Hypothyroidism, unspecified: Secondary | ICD-10-CM

## 2012-01-15 DIAGNOSIS — E038 Other specified hypothyroidism: Secondary | ICD-10-CM

## 2012-01-15 MED ORDER — LEVOTHYROXINE SODIUM 50 MCG PO TABS: 50.0000 ug | ORAL_TABLET | Freq: Every day | ORAL | Status: DC

## 2012-01-15 NOTE — Patient Instructions (Addendum)
  Begin samples of 50 mcg of Synthroid once a day as directed.  Contact us if there any concerns.  Recheck thyroid labs in about 2 months for 8 weeks and then followup visit. We can decide at that time with her to remain on medication. Or adjust it.    Monitor mood with the PH9 in the meantime.

## 2012-01-15 NOTE — Progress Notes (Signed)
  Subjective:    Patient ID: Patrick Hamilton, male    DOB: [birth year], 26 y.o.   MRN: [id number]  HPI Pt comes in for fu thyroid tests  Abnormal.  Feels pretty good at this point he doesn't feel like he has significant depression today. 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    Living at home when home. May change jobs in the future Sleep  Is fine. No increase fatigue concentration seems ok .  No etoh  Review of Systems Back  Still bothersome at times  Stretches.   Pain playing golf.  Had pain to leg.   Last week ok. There is to the gym on a regular basis. Has some bumps on his left hand for a while hasn't treated asks about this. Negative chest pain shortness of breath edema other change in skin.  No meds except allergy or or Multivite's. Past history family history social history reviewed in the electronic medical record. Grandmother family history of thyroid disease   Objective:   Physical Exam BP 112/68  Pulse 95  Temp 98.1 F (36.7 C) (Oral)  Wt 217 lb (98.431 kg)  SpO2 99% Well-developed well-nourished in no acute distress looks well today Neck: Palpable thyroid smooth no nodules no tenderness no adenopathy. Chest clear to auscultation cardiac S1-S2 no gallops murmurs gait within normal limits. Skin no edema 9 small warts on the dorsum of the left hand knuckle area. Palms are clear nails are clear. Neurologic oriented x3 affect appears normal DTRs are present without clonus no obvious delay.  Lab Results  Component Value Date   TSH 7.63* 01/08/2012   Free T4-T3 are normal as well as antibody studies    Assessment & Plan:  Subclinical Hypothyroid   Trend is increasing TSH. Fairly high level for someone his age. He does have a palpable goiter without nodules. Unsure if she is having symptoms but because of his history of mood although good today I would have him give it a trial to a baseline PH 9 and follow Would advise treatment  Trial because of  Age and  Subtle  Sx .  Pt agrees    Samples of 50 synthroid  10 weeks.  Disc about meds. Check tsh and free t4 pre visit in 2 months. Or as needed. Warts hands  Disc trial otc and if needed can add cryotherapy at fu.    Total visit > 50% spent counseling and coordinating care

## 2012-01-29 ENCOUNTER — Ambulatory Visit (INDEPENDENT_AMBULATORY_CARE_PROVIDER_SITE_OTHER): Payer: BC Managed Care – PPO | Admitting: Internal Medicine

## 2012-01-29 ENCOUNTER — Encounter: Payer: Self-pay | Admitting: Internal Medicine

## 2012-01-29 VITALS — BP 122/78 | HR 80 | Temp 98.5°F | Wt 218.0 lb

## 2012-01-29 DIAGNOSIS — L679 Hair color and hair shaft abnormality, unspecified: Secondary | ICD-10-CM

## 2012-01-29 DIAGNOSIS — E038 Other specified hypothyroidism: Secondary | ICD-10-CM

## 2012-01-29 DIAGNOSIS — R5383 Other fatigue: Secondary | ICD-10-CM

## 2012-01-29 DIAGNOSIS — E039 Hypothyroidism, unspecified: Secondary | ICD-10-CM

## 2012-01-29 MED ORDER — SYNTHROID 100 MCG PO TABS: 100.0000 ug | ORAL_TABLET | Freq: Every day | ORAL | Status: DC

## 2012-01-29 NOTE — Progress Notes (Signed)
  Subjective:    Patient ID: Patrick Hamilton, male    DOB: 09-26-1985, 26 y.o.   MRN: [id number]  HPI Pt comesin with mom today because of ongoing fatigue loss of energy despite on synthroid. Mom feels Patrick Hamilton not accurate in detail of his sx.  She is concerned  Ongoing for a year or so.  No cv pulm sx not better on over 3 weeks of synthroid.  Sleep sometimes interrupted no osa  No etoh or supplements .  No unusal diets.  Hair line receding?  concern about this some increase hair i shedding n shower.  No scalp conditions. No etoh su[pplments except a vitamin.  Review of Systems No sweats chills   Syncope.bleeding vision heating changes  Focal weakness. fam hx of osa father prostate cancer  Depression anxiety, low testosterone.  Past history family history social history reviewed in the electronic medical record.     Objective:   Physical Exam BP 122/78  Pulse 80  Temp 98.5 F (36.9 C) (Oral)  Wt 218 lb (98.884 kg)  SpO2 98% WDWN in nad looks well  Neck thyroid palpable no nodules. CV rr no g or m Skin: normal capillary refill ,turgor , color: No acute rashes ,petechiae or bruising  hiar  recedeng a bit temporal area . No rash  Lab Results  Component Value Date   TSH 7.63* 01/08/2012      Assessment & Plan:   Elevated TSH c/w subclinical hypothyroid but has sx that could be  From this state.  Agree with endocrine referral consider eval for low testosterone other conditions. Increase to 100 mcg  Samples   Se discussed and refer to endo.   Disc.  concerns about hair receding ( occurred pre synthroid)

## 2012-01-29 NOTE — Patient Instructions (Signed)
Increased to Synthroid 100 mcg a day. At this time stay on brand medication.  Calendar year sleep patterns over the next week or 2.  Will arrange a referral to endocrinology someone will contact you about this appointment.  Contact us in the meantime if needed.

## 2012-03-11 ENCOUNTER — Other Ambulatory Visit (INDEPENDENT_AMBULATORY_CARE_PROVIDER_SITE_OTHER): Payer: BC Managed Care – PPO

## 2012-03-11 DIAGNOSIS — E039 Hypothyroidism, unspecified: Secondary | ICD-10-CM

## 2012-03-11 LAB — T4, FREE: Free T4: 1.22 ng/dL (ref 0.60–1.60)

## 2012-03-11 LAB — TSH: TSH: 1.59 u[IU]/mL (ref 0.35–5.50)

## 2012-03-18 ENCOUNTER — Other Ambulatory Visit: Payer: BC Managed Care – PPO

## 2012-03-18 ENCOUNTER — Encounter: Payer: Self-pay | Admitting: Internal Medicine

## 2012-03-18 ENCOUNTER — Ambulatory Visit (INDEPENDENT_AMBULATORY_CARE_PROVIDER_SITE_OTHER): Payer: BC Managed Care – PPO | Admitting: Internal Medicine

## 2012-03-18 VITALS — BP 130/70 | HR 68 | Temp 98.1°F | Wt 209.0 lb

## 2012-03-18 DIAGNOSIS — E039 Hypothyroidism, unspecified: Secondary | ICD-10-CM

## 2012-03-18 DIAGNOSIS — E038 Other specified hypothyroidism: Secondary | ICD-10-CM

## 2012-03-18 DIAGNOSIS — M5090 Cervical disc disorder, unspecified, unspecified cervical region: Secondary | ICD-10-CM | POA: Insufficient documentation

## 2012-03-18 DIAGNOSIS — R5383 Other fatigue: Secondary | ICD-10-CM

## 2012-03-18 DIAGNOSIS — M519 Unspecified thoracic, thoracolumbar and lumbosacral intervertebral disc disorder: Secondary | ICD-10-CM

## 2012-03-18 DIAGNOSIS — Z23 Encounter for immunization: Secondary | ICD-10-CM

## 2012-03-18 DIAGNOSIS — M546 Pain in thoracic spine: Secondary | ICD-10-CM

## 2012-03-18 NOTE — Progress Notes (Signed)
  Subjective:    Patient ID: Patrick Hamilton, male    DOB: 11-19-1985, 26 y.o.   MRN: [id number]  HPI Patient comes in today for followup for thyroid problem he does have an appointment with an endocrinologist this week. Since his last visit he has noted some increase in energy and motivation although at one point may have had it decreases ability to sleep. No palpitations change in exercise tolerance he has lost some weight although he has had some decreased activity because of a disc problem in his neck and back.  He is seen Dr. Farris Has at [facility] for this. Considering injections if not getting better. He is a bit better this week.   Review of Systems No fever exercise tolerance change aggravated depression suicidality. No palpitations jitteriness Past history family history social history reviewed in the electronic medical record. Outpatient Encounter Prescriptions as of 03/18/2012  Medication Sig Dispense Refill  . fexofenadine (ALLEGRA) 180 MG tablet Take 180 mg by mouth daily.      Marland Kitchen SYNTHROID 100 MCG tablet Take 1 tablet (100 mcg total) by mouth daily.  30 tablet  3      Objective:   Physical Exam BP 130/70  Pulse 68  Temp 98.1 F (36.7 C) (Oral)  Wt 209 lb (94.802 kg)  SpO2 99% Well-developed well-nourished in no acute distress pulse regular rhythm gait within normal limits Lab Results  Component Value Date   TSH 1.59 03/11/2012    TSH free T4  on the normal range. This is about 2 months since the last adjustment. Reviewed record thyroid antibodies were -2 months ago.     Assessment & Plan:   Hypothyroidism  apparent good response to medication with fatigue and mood however uncertain how much this is contributing to the picture. Agree with continuing to see the thyroid specialist.  History of neck and back pain disc disease problematic.  Past opinions as he goes for work.  Followup when due for a checkup or if we are to continue with his thyroid medicine encouraged  to stay with the specialist   until long-term plan and judgment is stable. Flu immunization today

## 2012-03-18 NOTE — Patient Instructions (Addendum)
Your thyroid test is better continue the same medicine until you see Dr. Sharl Ma  Flu shot today. You can access your lab tests from the patient portal.  The thyroid antibodies were negative when we did them 2 months ago.  Followup when due for a checkup or as needed.

## 2012-04-30 ENCOUNTER — Encounter (HOSPITAL_COMMUNITY): Payer: Self-pay | Admitting: *Deleted

## 2012-04-30 ENCOUNTER — Inpatient Hospital Stay (HOSPITAL_COMMUNITY)
Admission: EM | Admit: 2012-04-30 | Discharge: 2012-05-03 | DRG: 758 | Disposition: A | Discharge status: Home / Self Care | Payer: BC Managed Care – PPO | Attending: Orthopedic Surgery | Admitting: Orthopedic Surgery

## 2012-04-30 ENCOUNTER — Emergency Department (HOSPITAL_COMMUNITY): Payer: BC Managed Care – PPO

## 2012-04-30 DIAGNOSIS — Z79899 Other long term (current) drug therapy: Secondary | ICD-10-CM

## 2012-04-30 DIAGNOSIS — M519 Unspecified thoracic, thoracolumbar and lumbosacral intervertebral disc disorder: Secondary | ICD-10-CM

## 2012-04-30 DIAGNOSIS — M5090 Cervical disc disorder, unspecified, unspecified cervical region: Secondary | ICD-10-CM

## 2012-04-30 DIAGNOSIS — L679 Hair color and hair shaft abnormality, unspecified: Secondary | ICD-10-CM

## 2012-04-30 DIAGNOSIS — E038 Other specified hypothyroidism: Secondary | ICD-10-CM

## 2012-04-30 DIAGNOSIS — B079 Viral wart, unspecified: Secondary | ICD-10-CM

## 2012-04-30 DIAGNOSIS — J309 Allergic rhinitis, unspecified: Secondary | ICD-10-CM

## 2012-04-30 DIAGNOSIS — Z9889 Other specified postprocedural states: Secondary | ICD-10-CM

## 2012-04-30 DIAGNOSIS — F1021 Alcohol dependence, in remission: Secondary | ICD-10-CM

## 2012-04-30 DIAGNOSIS — R5383 Other fatigue: Secondary | ICD-10-CM

## 2012-04-30 DIAGNOSIS — F3289 Other specified depressive episodes: Secondary | ICD-10-CM | POA: Diagnosis present

## 2012-04-30 DIAGNOSIS — M5126 Other intervertebral disc displacement, lumbar region: Principal | ICD-10-CM | POA: Diagnosis present

## 2012-04-30 DIAGNOSIS — F329 Major depressive disorder, single episode, unspecified: Secondary | ICD-10-CM | POA: Diagnosis present

## 2012-04-30 DIAGNOSIS — E039 Hypothyroidism, unspecified: Secondary | ICD-10-CM

## 2012-04-30 DIAGNOSIS — L708 Other acne: Secondary | ICD-10-CM

## 2012-04-30 DIAGNOSIS — L259 Unspecified contact dermatitis, unspecified cause: Secondary | ICD-10-CM

## 2012-04-30 DIAGNOSIS — M546 Pain in thoracic spine: Secondary | ICD-10-CM

## 2012-04-30 HISTORY — DX: Major depressive disorder, single episode, unspecified: F32.9

## 2012-04-30 HISTORY — DX: Hypothyroidism, unspecified: E03.9

## 2012-04-30 HISTORY — DX: Depression, unspecified: F32.A

## 2012-04-30 LAB — COMPREHENSIVE METABOLIC PANEL
AST: 21 U/L (ref 0–37)
Albumin: 4.1 g/dL (ref 3.5–5.2)
Alkaline Phosphatase: 51 U/L (ref 39–117)
Chloride: 102 mEq/L (ref 96–112)
Potassium: 3.9 mEq/L (ref 3.5–5.1)
Sodium: 140 mEq/L (ref 135–145)
Total Bilirubin: 0.5 mg/dL (ref 0.3–1.2)

## 2012-04-30 LAB — URINALYSIS, ROUTINE W REFLEX MICROSCOPIC
Glucose, UA: NEGATIVE mg/dL
Hgb urine dipstick: NEGATIVE
Leukocytes, UA: NEGATIVE
Specific Gravity, Urine: 1.01 (ref 1.005–1.030)
Urobilinogen, UA: 1 mg/dL (ref 0.0–1.0)

## 2012-04-30 LAB — PROTIME-INR: INR: 1.18 (ref 0.00–1.49)

## 2012-04-30 LAB — CBC WITH DIFFERENTIAL/PLATELET
Basophils Relative: 0 % (ref 0–1)
Hemoglobin: 15.6 g/dL (ref 13.0–17.0)
Lymphocytes Relative: 26 % (ref 12–46)
Lymphs Abs: 1.7 10*3/uL (ref 0.7–4.0)
Monocytes Relative: 11 % (ref 3–12)
Neutro Abs: 4.1 10*3/uL (ref 1.7–7.7)
Neutrophils Relative %: 62 % (ref 43–77)
RBC: 5.03 MIL/uL (ref 4.22–5.81)

## 2012-04-30 LAB — POCT I-STAT, CHEM 8
Chloride: 103 mEq/L (ref 96–112)
Creatinine, Ser: 1.2 mg/dL (ref 0.50–1.35)
Glucose, Bld: 87 mg/dL (ref 70–99)
HCT: 48 % (ref 39.0–52.0)
Potassium: 4.2 mEq/L (ref 3.5–5.1)

## 2012-04-30 LAB — TYPE AND SCREEN

## 2012-04-30 LAB — ABO/RH: ABO/RH(D): A NEG

## 2012-04-30 MED ORDER — POTASSIUM CL IN DEXTROSE 5% 20 MEQ/L IV SOLN
20.0000 meq | INTRAVENOUS | Status: DC
  Administered 2012-05-01 (×2): 20 meq via INTRAVENOUS
  Filled 2012-04-30 (×10): qty 1000

## 2012-04-30 MED ORDER — METHOCARBAMOL 100 MG/ML IJ SOLN: 1000.0000 mg | Freq: Once | INTRAMUSCULAR | Status: DC

## 2012-04-30 MED ORDER — CHLORHEXIDINE GLUCONATE 4 % EX LIQD
60.0000 mL | Freq: Once | CUTANEOUS | Status: DC
  Filled 2012-04-30 (×2): qty 60

## 2012-04-30 MED ORDER — ZOLPIDEM TARTRATE 5 MG PO TABS: 5.0000 mg | ORAL_TABLET | Freq: Every evening | ORAL | Status: DC | PRN

## 2012-04-30 MED ORDER — LEVOTHYROXINE SODIUM 100 MCG PO TABS
100.0000 ug | ORAL_TABLET | Freq: Every day | ORAL | Status: DC
  Administered 2012-05-01 – 2012-05-03 (×3): 100 ug via ORAL
  Filled 2012-04-30 (×4): qty 1

## 2012-04-30 MED ORDER — DEXTROSE 5 % IV SOLN
3.0000 g | INTRAVENOUS | Status: AC
  Administered 2012-05-02: 3 g via INTRAVENOUS
  Filled 2012-04-30: qty 3000

## 2012-04-30 MED ORDER — HYDROMORPHONE HCL PF 1 MG/ML IJ SOLN
1.0000 mg | Freq: Once | INTRAMUSCULAR | Status: AC
  Administered 2012-04-30: 1 mg via INTRAVENOUS
  Filled 2012-04-30: qty 1

## 2012-04-30 MED ORDER — MORPHINE SULFATE 2 MG/ML IJ SOLN
INTRAMUSCULAR | Status: AC
  Filled 2012-04-30: qty 1

## 2012-04-30 MED ORDER — SODIUM CHLORIDE 0.9 % IV SOLN: 75.0000 mL/h | INTRAVENOUS | Status: DC

## 2012-04-30 MED ORDER — METHOCARBAMOL 100 MG/ML IJ SOLN
1000.0000 mg | INTRAVENOUS | Status: AC
  Administered 2012-04-30: 1000 mg via INTRAVENOUS
  Filled 2012-04-30: qty 10

## 2012-04-30 MED ORDER — ONDANSETRON HCL 4 MG PO TABS
4.0000 mg | ORAL_TABLET | Freq: Four times a day (QID) | ORAL | Status: DC | PRN
  Filled 2012-04-30: qty 0.5

## 2012-04-30 MED ORDER — MORPHINE SULFATE 2 MG/ML IJ SOLN
2.0000 mg | INTRAMUSCULAR | Status: DC | PRN
  Administered 2012-04-30 – 2012-05-02 (×6): 2 mg via INTRAVENOUS
  Filled 2012-04-30 (×5): qty 1

## 2012-04-30 MED ORDER — SODIUM CHLORIDE 0.9 % IV SOLN
Freq: Once | INTRAVENOUS | Status: AC
  Administered 2012-04-30: 10 mL/h via INTRAVENOUS

## 2012-04-30 MED ORDER — SENNA 8.6 MG PO TABS
1.0000 | ORAL_TABLET | Freq: Two times a day (BID) | ORAL | Status: DC
  Administered 2012-05-01 – 2012-05-03 (×5): 8.6 mg via ORAL
  Filled 2012-04-30 (×7): qty 1

## 2012-04-30 MED ORDER — ONDANSETRON HCL 4 MG/2ML IJ SOLN: 4.0000 mg | Freq: Four times a day (QID) | INTRAMUSCULAR | Status: DC | PRN

## 2012-04-30 MED ORDER — HYDROCODONE-ACETAMINOPHEN 5-325 MG PO TABS
1.0000 | ORAL_TABLET | ORAL | Status: DC | PRN
  Administered 2012-04-30 – 2012-05-01 (×4): 2 via ORAL
  Administered 2012-05-02: 1 via ORAL
  Administered 2012-05-02 – 2012-05-03 (×5): 2 via ORAL
  Filled 2012-04-30 (×11): qty 2

## 2012-04-30 NOTE — ED Notes (Signed)
Pt remains in MRI at this time  

## 2012-04-30 NOTE — ED Notes (Signed)
Report given to [facility] in CDU, pt moved to CDU 4.

## 2012-04-30 NOTE — ED Notes (Signed)
Regular Diet ordered spoke to [place]

## 2012-04-30 NOTE — ED Notes (Signed)
PT has herniated disc and states severe sciatica and is reporting problems with emptying bladder for 2 days.  No incontinence of bowel or bladder.  No abdominal pain

## 2012-04-30 NOTE — ED Provider Notes (Signed)
History     CSN: [id number]  Arrival date & time 04/30/12  1135   First MD Initiated Contact with Patient 04/30/12 1425      Chief Complaint  Patient presents with  . Back Pain  . Urinary Retention    (Consider location/radiation/quality/duration/timing/severity/associated sxs/prior treatment) HPI Pt with reported history of lumbar disk disease report several days of increasing pain in lower back, radiating to L leg worse with movement. He reports since last night he has had difficulty with completely emptying his bladder. Denies overflow incontinence, no groin numbness.   Past Medical History  Diagnosis Date  . Compartment syndrome 2010    right leg  . Acne   . Allergic rhinitis   . Sever's disease     Past Surgical History  Procedure Date  . Tonsillectomy and adenoidectomy 2003  . Rhinoplasty 1999  . Compartment syndrome release 2010  . Ed visit  2011    [place] for Alcohol related problem  . Nose fracture   . Growth plate rt foot     Family History  Problem Relation Age of Onset  . Prostate cancer Father 96  . Autism Brother   . Diabetes    . Allergies    . Alcohol abuse      mom's side of the family    History  Substance Use Topics  . Smoking status: Never Smoker   . Smokeless tobacco: Not on file  . Alcohol Use: No      Review of Systems All other systems reviewed and are negative except as noted in HPI.   Allergies  Review of patient's allergies indicates no known allergies.  Home Medications   Current Outpatient Rx  Name  Route  Sig  Dispense  Refill  . FEXOFENADINE HCL 180 MG PO TABS   Oral   Take 180 mg by mouth daily.         Marland Kitchen SYNTHROID 100 MCG PO TABS   Oral   Take 1 tablet (100 mcg total) by mouth daily.   30 tablet   3     Dispense as written.     BP 135/80  Pulse 112  Temp 98.7 F (37.1 C) (Oral)  Resp 22  SpO2 100%  Physical Exam  Nursing note and vitals reviewed. Constitutional: He is oriented to person,  place, and time. He appears well-developed and well-nourished.  HENT:  Head: Normocephalic and atraumatic.  Eyes: EOM are normal. Pupils are equal, round, and reactive to light.  Neck: Normal range of motion. Neck supple.  Cardiovascular: Normal rate, normal heart sounds and intact distal pulses.   Pulmonary/Chest: Effort normal and breath sounds normal.  Abdominal: Bowel sounds are normal. He exhibits no distension. There is no tenderness.  Musculoskeletal: Normal range of motion. He exhibits no edema and no tenderness.       Lumbar back: He exhibits tenderness.  Neurological: He is alert and oriented to person, place, and time. He has normal strength. He displays normal reflexes. No cranial nerve deficit or sensory deficit.  Skin: Skin is warm and dry. No rash noted.  Psychiatric: He has a normal mood and affect.    ED Course  Procedures (including critical care time)   Labs Reviewed  CBC WITH DIFFERENTIAL  POCT I-STAT, CHEM 8  URINALYSIS, ROUTINE W REFLEX MICROSCOPIC   Mr Lumbar Spine Wo Contrast  04/30/2012  *RADIOLOGY REPORT*  Clinical Data: 26 year old male with right lower extremity pain.  MRI LUMBAR SPINE WITHOUT CONTRAST  Technique:  Multiplanar and multiecho pulse sequences of the lumbar spine were obtained without intravenous contrast.  Comparison: 02/08/2012.  Findings: Stable and normal lumbar vertebral height alignment. Mild degenerative endplate changes and L5-S1. No marrow edema or evidence of acute osseous abnormality.  Visualized paraspinal soft tissues are within normal limits.   Visualized lower thoracic spinal cord is normal with conus medularis at L1.  Negative visualized abdominal viscera.  T11-T12:  Negative.  T12-L1:  Negative.  L1-L2:  Negative.  L2-L3:  Negative.  L3-L4:  Negative.  L4-L5:  Stable disc desiccation.  A central to left paracentral annular tear with subtle disc bulge is not significantly changed. No associated neural impingement.  L5-S1:  Disc  desiccation.  Increased right lateral recess disc extrusion.  Severe involvement of the descending right S1 nerve roots as before.  Increased mass effect on the right S2 nerve roots.  Minimal to mild overall spinal stenosis at this level.  As before, no definite involvement of the right exiting L5 nerve.  IMPRESSION: 1.  Interval progression of right L5-S1 disc extrusion severely affecting the right lateral recess. 2.  Stable less pronounced L4-L5 disc degeneration.   Original Report Authenticated By: Erskine Speed, M.D.      No diagnosis found.    MDM  Will send for MRI to rule out cord compression given reports of ?urinary retention although he was able to urinate in the lobby prior to my eval.   Discussed MRI results with PA at [facility], awaiting call back for recommendations, care signed out at the change of shift.        Charles B. Bernette Mayers, MD 04/30/12 1701

## 2012-04-30 NOTE — H&P (Signed)
CC: severe increasing right sciatica  HPI worsening right sciatica over the last 2-3 months, MRI of lumbar spine shows an increasing lumbar disc protusion - extruded fragment.  Pain radiates as far as the tight ankle.  Also has some symptoms of having to push harder to void with increasing urinary frequency.  However he has normal bladder sensation and can feel his urinary stream when voiding.  ROS- hypo thyroid, has been on thyroxin for 2-3 months  Past Hx: 2009 right lower leg compartment syndrome ==> fasciotomy; Nasal fracture - treated with surgery 1999; # of 5th metatarsal in 2000  O/E:  Large stature - Height 6'2" and weighs 210 lbs, lying on gurney  HEENT: neg  CVS: no murmurs heard, reg heart rate  RESP:  Clear  ABD: soft, non-tender  Rectal - normal anal sphincter, good volitional contraction, sensation intact  MS/Neruro - exam'd on gurney; No weakness either LE; dec'd light touch sensation right L5 and S1, KJ's - 2/4; AJ's right = 0/4 and left 1+/4;  SLR - right = 45; left = 90.   MRI: large extruded right sided L5-S1 HNP, increased in size compared with recent exam done in approx August.   Asse't:  Worsening right sciatica with increased in size of HNP, no sign of bladder or anal sphincter dysfunction or lower extremity weakness  Plan: because of severity of pain and worsening, I am admitting and plan to perform a laminotomy and disc excision tomorrow. I have executed the informed consent process with him and his mother including a thorough discussion of general and specific complications.  I have spoken to his father on the phone.  I have spent in excess of one hour face to face.

## 2012-04-30 NOTE — ED Notes (Signed)
Patient transported to MRI 

## 2012-04-30 NOTE — ED Notes (Signed)
Pt has hx of ruptured disc in back- states slipped and fell last Tuesday and has had continual pain since. Pt also reports urinary retention and frequency. Pain radiates from lower back down right leg. No neuro deficits noted.

## 2012-04-30 NOTE — ED Notes (Signed)
Will give robaxin when pt returns from MRI.

## 2012-05-01 ENCOUNTER — Inpatient Hospital Stay (HOSPITAL_COMMUNITY): Payer: BC Managed Care – PPO

## 2012-05-01 NOTE — H&P (Signed)
Addendum:  In the ED last evening general and specific complications were discussed as follows: General - death, heart attack, stroke, pneumonia, thromboembolic disease, infection, hemorrhage.  Specific - nerve injury with attendant lower extremity weakness, loss of sensation and/or increased or new pain; CSF leak with the need for postoperative recumbency, epidural heamatoma with the need for return to the OR; recurrent disc herniation with the need for reoperation; continued baseline back pain which he has been experiencing since 2009.  I have explained that this is a leg pain operation.  I do not feel that in the current circumstance, acutely immobilized by severe sciatica and functional within the recent past that we should be embarking upon a fusion in addition to a disc excision, although it is is certainly very possible that he might end up with that at some future point.  I have explained to him and his mother that my main concern will be that although his leg pain improves he will continue to have significant axial back pain.  Also the H&P recorded last evening does not give a detailed account of his ADL deficiencies:  Currently he is virtually bed/house bound.  Walking, sitting, standing, bending, lifting, travelling, socializing capacity is close to nil.  He is having difficulty sleeping.  The prospect non-surgical measures such as oral or epidural steroids materially affecting the natural course of his disc herniation is extremely low.

## 2012-05-01 NOTE — Progress Notes (Signed)
Seen in room 5N09 this afternoon  S: more comfortable than last night on meds  O: no weakness in Tib Ant, Peronei or Gastroc/Sol bilaterally  A: waiting for OR  P: L5-S1 disc excision today, right side

## 2012-05-01 NOTE — Progress Notes (Signed)
Utilization review completed. Jackob Crookston, RN, BSN. 

## 2012-05-01 NOTE — Progress Notes (Signed)
I spoke with the or which has already given me tow grossly overly optimistic estimations.  At this point the OR is so busy that no estimation can be given.  Therefore we will defer surgery until tomorrow.  It would appear that we can follow a case in the mid to late morning and will do so.  I have spoken to the patient and the RN looking after him.  He may have supper.

## 2012-05-02 ENCOUNTER — Inpatient Hospital Stay (HOSPITAL_COMMUNITY): Payer: BC Managed Care – PPO | Admitting: *Deleted

## 2012-05-02 ENCOUNTER — Encounter (HOSPITAL_COMMUNITY): Payer: Self-pay | Admitting: *Deleted

## 2012-05-02 ENCOUNTER — Encounter (HOSPITAL_COMMUNITY)
Admission: EM | Disposition: A | Discharge status: Home / Self Care | Payer: Self-pay | Source: Home / Self Care | Attending: Orthopedic Surgery

## 2012-05-02 ENCOUNTER — Inpatient Hospital Stay (HOSPITAL_COMMUNITY): Payer: BC Managed Care – PPO

## 2012-05-02 HISTORY — PX: LUMBAR LAMINECTOMY/DECOMPRESSION MICRODISCECTOMY: SHX5026

## 2012-05-02 SURGERY — LUMBAR LAMINECTOMY/DECOMPRESSION MICRODISCECTOMY
Anesthesia: General | Site: Back | Laterality: Right | Wound class: Clean

## 2012-05-02 MED ORDER — 0.9 % SODIUM CHLORIDE (POUR BTL) OPTIME
TOPICAL | Status: DC | PRN
  Administered 2012-05-02: 1000 mL

## 2012-05-02 MED ORDER — SODIUM CHLORIDE 0.9 % IJ SOLN
3.0000 mL | Freq: Two times a day (BID) | INTRAMUSCULAR | Status: DC
  Administered 2012-05-02: 3 mL via INTRAVENOUS

## 2012-05-02 MED ORDER — MIDAZOLAM HCL 5 MG/5ML IJ SOLN
INTRAMUSCULAR | Status: DC | PRN
  Administered 2012-05-02: 2 mg via INTRAVENOUS

## 2012-05-02 MED ORDER — METHOCARBAMOL 100 MG/ML IJ SOLN
500.0000 mg | Freq: Four times a day (QID) | INTRAVENOUS | Status: DC | PRN
  Filled 2012-05-02: qty 5

## 2012-05-02 MED ORDER — METHOCARBAMOL 500 MG PO TABS
500.0000 mg | ORAL_TABLET | Freq: Four times a day (QID) | ORAL | Status: DC | PRN
  Administered 2012-05-02: 500 mg via ORAL
  Filled 2012-05-02: qty 1

## 2012-05-02 MED ORDER — LACTATED RINGERS IV SOLN
INTRAVENOUS | Status: DC | PRN
  Administered 2012-05-02 (×2): via INTRAVENOUS

## 2012-05-02 MED ORDER — ZOLPIDEM TARTRATE 5 MG PO TABS: 5.0000 mg | ORAL_TABLET | Freq: Every evening | ORAL | Status: DC | PRN

## 2012-05-02 MED ORDER — DEXAMETHASONE SODIUM PHOSPHATE 4 MG/ML IJ SOLN
INTRAMUSCULAR | Status: DC | PRN
  Administered 2012-05-02: 8 mg via INTRAVENOUS

## 2012-05-02 MED ORDER — BUPIVACAINE HCL (PF) 0.25 % IJ SOLN
INTRAMUSCULAR | Status: DC | PRN
  Administered 2012-05-02: 4.5 mL

## 2012-05-02 MED ORDER — PHENOL 1.4 % MT LIQD: 1.0000 | OROMUCOSAL | Status: DC | PRN

## 2012-05-02 MED ORDER — MORPHINE SULFATE 2 MG/ML IJ SOLN: 2.0000 mg | INTRAMUSCULAR | Status: DC | PRN

## 2012-05-02 MED ORDER — PROPOFOL 10 MG/ML IV BOLUS
INTRAVENOUS | Status: DC | PRN
  Administered 2012-05-02: 200 mg via INTRAVENOUS

## 2012-05-02 MED ORDER — HYDROMORPHONE HCL PF 1 MG/ML IJ SOLN
0.2500 mg | INTRAMUSCULAR | Status: DC | PRN
  Administered 2012-05-02 (×2): 0.5 mg via INTRAVENOUS

## 2012-05-02 MED ORDER — PANTOPRAZOLE SODIUM 40 MG IV SOLR
40.0000 mg | Freq: Every day | INTRAVENOUS | Status: DC
  Administered 2012-05-02: 40 mg via INTRAVENOUS
  Filled 2012-05-02 (×2): qty 40

## 2012-05-02 MED ORDER — OXYCODONE HCL 5 MG/5ML PO SOLN: 5.0000 mg | Freq: Once | ORAL | Status: DC | PRN

## 2012-05-02 MED ORDER — ONDANSETRON HCL 4 MG/2ML IJ SOLN
INTRAMUSCULAR | Status: DC | PRN
  Administered 2012-05-02: 4 mg via INTRAVENOUS

## 2012-05-02 MED ORDER — ACETAMINOPHEN 325 MG PO TABS: 650.0000 mg | ORAL_TABLET | ORAL | Status: DC | PRN

## 2012-05-02 MED ORDER — ACETAMINOPHEN 650 MG RE SUPP: 650.0000 mg | RECTAL | Status: DC | PRN

## 2012-05-02 MED ORDER — KETOROLAC TROMETHAMINE 30 MG/ML IJ SOLN
30.0000 mg | Freq: Four times a day (QID) | INTRAMUSCULAR | Status: DC
  Administered 2012-05-02 – 2012-05-03 (×3): 30 mg via INTRAVENOUS
  Filled 2012-05-02 (×3): qty 1

## 2012-05-02 MED ORDER — SODIUM CHLORIDE 0.9 % IV SOLN: 250.0000 mL | INTRAVENOUS | Status: DC

## 2012-05-02 MED ORDER — POTASSIUM CHLORIDE IN NACL 20-0.45 MEQ/L-% IV SOLN
INTRAVENOUS | Status: DC
  Administered 2012-05-02: 19:00:00 via INTRAVENOUS
  Filled 2012-05-02 (×3): qty 1000

## 2012-05-02 MED ORDER — BUPIVACAINE LIPOSOME 1.3 % IJ SUSP
20.0000 mL | Freq: Once | INTRAMUSCULAR | Status: AC
  Administered 2012-05-02: 266 mg
  Filled 2012-05-02: qty 20

## 2012-05-02 MED ORDER — GLYCOPYRROLATE 0.2 MG/ML IJ SOLN
INTRAMUSCULAR | Status: DC | PRN
  Administered 2012-05-02: .8 mg via INTRAVENOUS

## 2012-05-02 MED ORDER — LIDOCAINE HCL (CARDIAC) 20 MG/ML IV SOLN
INTRAVENOUS | Status: DC | PRN
  Administered 2012-05-02: 100 mg via INTRAVENOUS

## 2012-05-02 MED ORDER — CEFAZOLIN SODIUM 1-5 GM-% IV SOLN
1.0000 g | Freq: Three times a day (TID) | INTRAVENOUS | Status: AC
  Administered 2012-05-02 – 2012-05-03 (×2): 1 g via INTRAVENOUS
  Filled 2012-05-02 (×3): qty 50

## 2012-05-02 MED ORDER — LIDOCAINE-EPINEPHRINE (PF) 1 %-1:200000 IJ SOLN
INTRAMUSCULAR | Status: DC | PRN
  Administered 2012-05-02: 4.5 mL

## 2012-05-02 MED ORDER — HYDROCODONE-ACETAMINOPHEN 5-325 MG PO TABS: 1.0000 | ORAL_TABLET | ORAL | Status: DC | PRN

## 2012-05-02 MED ORDER — METOCLOPRAMIDE HCL 5 MG/ML IJ SOLN: 10.0000 mg | Freq: Once | INTRAMUSCULAR | Status: DC | PRN

## 2012-05-02 MED ORDER — ONDANSETRON HCL 4 MG/2ML IJ SOLN: 4.0000 mg | INTRAMUSCULAR | Status: DC | PRN

## 2012-05-02 MED ORDER — MENTHOL 3 MG MT LOZG: 1.0000 | LOZENGE | OROMUCOSAL | Status: DC | PRN

## 2012-05-02 MED ORDER — ROCURONIUM BROMIDE 100 MG/10ML IV SOLN
INTRAVENOUS | Status: DC | PRN
  Administered 2012-05-02 (×2): 5 mg via INTRAVENOUS
  Administered 2012-05-02: 50 mg via INTRAVENOUS

## 2012-05-02 MED ORDER — SODIUM CHLORIDE 0.9 % IV SOLN
10.0000 mg | INTRAVENOUS | Status: DC | PRN
  Administered 2012-05-02: 10 ug/min via INTRAVENOUS

## 2012-05-02 MED ORDER — SENNA 8.6 MG PO TABS: 1.0000 | ORAL_TABLET | Freq: Two times a day (BID) | ORAL | Status: DC

## 2012-05-02 MED ORDER — THROMBIN 20000 UNITS EX SOLR
CUTANEOUS | Status: DC | PRN
  Administered 2012-05-02: 14:00:00 via TOPICAL

## 2012-05-02 MED ORDER — SODIUM CHLORIDE 0.9 % IR SOLN
Status: DC | PRN
  Administered 2012-05-02: 14:00:00

## 2012-05-02 MED ORDER — OXYCODONE HCL 5 MG PO TABS: 5.0000 mg | ORAL_TABLET | Freq: Once | ORAL | Status: DC | PRN

## 2012-05-02 MED ORDER — SODIUM CHLORIDE 0.9 % IJ SOLN: 3.0000 mL | INTRAMUSCULAR | Status: DC | PRN

## 2012-05-02 MED ORDER — NEOSTIGMINE METHYLSULFATE 1 MG/ML IJ SOLN
INTRAMUSCULAR | Status: DC | PRN
  Administered 2012-05-02: 4 mg via INTRAVENOUS

## 2012-05-02 MED ORDER — FENTANYL CITRATE 0.05 MG/ML IJ SOLN
INTRAMUSCULAR | Status: DC | PRN
  Administered 2012-05-02: 150 ug via INTRAVENOUS
  Administered 2012-05-02: 100 ug via INTRAVENOUS

## 2012-05-02 SURGICAL SUPPLY — 81 items
APL SKNCLS STERI-STRIP NONHPOA (GAUZE/BANDAGES/DRESSINGS) ×1
BAG DECANTER FOR FLEXI CONT (MISCELLANEOUS) ×2 IMPLANT
BANDAGE GAUZE ELAST BULKY 4 IN (GAUZE/BANDAGES/DRESSINGS) ×1 IMPLANT
BENZOIN TINCTURE PRP APPL 2/3 (GAUZE/BANDAGES/DRESSINGS) ×3 IMPLANT
BLADE SURG ROTATE 9660 (MISCELLANEOUS) IMPLANT
BUR MATCHSTICK NEURO 3.0 LAGG (BURR) ×2 IMPLANT
CARTRIDGE OIL MAESTRO DRILL (MISCELLANEOUS) ×1 IMPLANT
CLOTH BEACON ORANGE TIMEOUT ST (SAFETY) ×2 IMPLANT
CLSR STERI-STRIP ANTIMIC 1/2X4 (GAUZE/BANDAGES/DRESSINGS) ×1 IMPLANT
CORDS BIPOLAR (ELECTRODE) ×2 IMPLANT
COVER SURGICAL LIGHT HANDLE (MISCELLANEOUS) ×2 IMPLANT
DIFFUSER DRILL AIR PNEUMATIC (MISCELLANEOUS) ×2 IMPLANT
DRAIN CHANNEL 15F RND FF W/TCR (WOUND CARE) ×2 IMPLANT
DRAPE PROXIMA HALF (DRAPES) ×4 IMPLANT
DRAPE SURG 17X23 STRL (DRAPES) ×8 IMPLANT
DRAPE TABLE COVER HEAVY DUTY (DRAPES) ×1 IMPLANT
DRSG MEPILEX BORDER 4X4 (GAUZE/BANDAGES/DRESSINGS) ×1 IMPLANT
DRSG OPSITE 11X17.75 LRG (GAUZE/BANDAGES/DRESSINGS) ×2 IMPLANT
DRSG PAD ABDOMINAL 8X10 ST (GAUZE/BANDAGES/DRESSINGS) ×2 IMPLANT
DURAPREP 26ML APPLICATOR (WOUND CARE) ×2 IMPLANT
ELECT BLADE 4.0 EZ CLEAN MEGAD (MISCELLANEOUS) ×2
ELECT CAUTERY BLADE 6.4 (BLADE) ×2 IMPLANT
ELECT REM PT RETURN 9FT ADLT (ELECTROSURGICAL) ×2
ELECTRODE BLDE 4.0 EZ CLN MEGD (MISCELLANEOUS) ×1 IMPLANT
ELECTRODE REM PT RTRN 9FT ADLT (ELECTROSURGICAL) ×1 IMPLANT
EVACUATOR 1/8 PVC DRAIN (DRAIN) IMPLANT
EVACUATOR SILICONE 100CC (DRAIN) ×2 IMPLANT
FILTER STRAW FLUID ASPIR (MISCELLANEOUS) IMPLANT
GAUZE SPONGE 4X4 16PLY XRAY LF (GAUZE/BANDAGES/DRESSINGS) ×2 IMPLANT
GLOVE BIOGEL PI IND STRL 7.5 (GLOVE) ×1 IMPLANT
GLOVE BIOGEL PI IND STRL 9 (GLOVE) ×1 IMPLANT
GLOVE BIOGEL PI INDICATOR 7.5 (GLOVE) ×1
GLOVE BIOGEL PI INDICATOR 9 (GLOVE) ×1
GLOVE SS BIOGEL STRL SZ 7 (GLOVE) ×1 IMPLANT
GLOVE SS BIOGEL STRL SZ 8.5 (GLOVE) ×1 IMPLANT
GLOVE SUPERSENSE BIOGEL SZ 7 (GLOVE) ×1
GLOVE SUPERSENSE BIOGEL SZ 8.5 (GLOVE) ×1
GOWN PREVENTION PLUS XLARGE (GOWN DISPOSABLE) ×2 IMPLANT
GOWN STRL NON-REIN LRG LVL3 (GOWN DISPOSABLE) ×4 IMPLANT
IV CATH 14GX2 1/4 (CATHETERS) IMPLANT
KIT BASIN OR (CUSTOM PROCEDURE TRAY) ×2 IMPLANT
KIT POSITION SURG JACKSON T1 (MISCELLANEOUS) ×2 IMPLANT
KIT ROOM TURNOVER OR (KITS) ×2 IMPLANT
NDL SPNL 18GX3.5 QUINCKE PK (NEEDLE) IMPLANT
NDL SPNL 22GX3.5 QUINCKE BK (NEEDLE) ×1 IMPLANT
NEEDLE 27GAX1X1/2 (NEEDLE) IMPLANT
NEEDLE SPNL 18GX3.5 QUINCKE PK (NEEDLE) ×2 IMPLANT
NEEDLE SPNL 22GX3.5 QUINCKE BK (NEEDLE) ×2 IMPLANT
NS IRRIG 1000ML POUR BTL (IV SOLUTION) ×2 IMPLANT
OIL CARTRIDGE MAESTRO DRILL (MISCELLANEOUS) ×2
PACK LAMINECTOMY ORTHO (CUSTOM PROCEDURE TRAY) ×2 IMPLANT
PACK UNIVERSAL I (CUSTOM PROCEDURE TRAY) ×2 IMPLANT
PAD ARMBOARD 7.5X6 YLW CONV (MISCELLANEOUS) ×4 IMPLANT
PATTIES SURGICAL .5 X1 (DISPOSABLE) ×2 IMPLANT
PATTIES SURGICAL .75X.75 (GAUZE/BANDAGES/DRESSINGS) ×1 IMPLANT
PATTIES SURGICAL 1X1 (DISPOSABLE) ×1 IMPLANT
SPONGE GAUZE 4X4 12PLY (GAUZE/BANDAGES/DRESSINGS) ×2 IMPLANT
SPONGE NEURO XRAY DETECT 1X3 (DISPOSABLE) IMPLANT
SPONGE SURGIFOAM ABS GEL 100 (HEMOSTASIS) ×2 IMPLANT
STRIP CLOSURE SKIN 1/2X4 (GAUZE/BANDAGES/DRESSINGS) ×4 IMPLANT
SURGIFLO TRUKIT (HEMOSTASIS) ×2 IMPLANT
SUT ETHILON 2 0 FS 18 (SUTURE) ×2 IMPLANT
SUT VIC AB 1 CT1 18XCR BRD 8 (SUTURE) ×2 IMPLANT
SUT VIC AB 1 CT1 8-18 (SUTURE) ×2
SUT VIC AB 1 CTX 18 (SUTURE) IMPLANT
SUT VIC AB 1 CTX 36 (SUTURE) ×2
SUT VIC AB 1 CTX36XBRD ANBCTR (SUTURE) ×5 IMPLANT
SUT VIC AB 2-0 CT1 18 (SUTURE) ×2 IMPLANT
SUT VIC AB 2-0 CT1 27 (SUTURE) ×2
SUT VIC AB 2-0 CT1 TAPERPNT 27 (SUTURE) ×2 IMPLANT
SUT VIC AB 3-0 X1 27 (SUTURE) ×3 IMPLANT
SYR 20CC LL (SYRINGE) ×1 IMPLANT
SYR 3ML LL SCALE MARK (SYRINGE) IMPLANT
SYR BULB IRRIGATION 50ML (SYRINGE) ×2 IMPLANT
SYR CONTROL 10ML LL (SYRINGE) ×4 IMPLANT
SYRINGE 10CC LL (SYRINGE) ×2 IMPLANT
TOWEL OR 17X24 6PK STRL BLUE (TOWEL DISPOSABLE) ×2 IMPLANT
TOWEL OR 17X26 10 PK STRL BLUE (TOWEL DISPOSABLE) ×2 IMPLANT
TRAY FOLEY CATH 14FR (SET/KITS/TRAYS/PACK) ×2 IMPLANT
TUBE SUCT ARGYLE STRL (TUBING) ×2 IMPLANT
WATER STERILE IRR 1000ML POUR (IV SOLUTION) ×4 IMPLANT

## 2012-05-02 NOTE — Progress Notes (Signed)
Seen in PACU  -  Moves both feet and ankles well, dramatic improvement in right sciatic pain.

## 2012-05-02 NOTE — Brief Op Note (Signed)
Right L5-S1 laminotomy and disc excision, no complications, negligible EBL.  Op findings - huge extruded disc fragment's

## 2012-05-02 NOTE — Anesthesia Postprocedure Evaluation (Signed)
Anesthesia Post Note  Patient: Patrick Hamilton  Procedure(s) Performed: Procedure(s) (LRB): LUMBAR LAMINECTOMY/DECOMPRESSION MICRODISCECTOMY (Right)  Anesthesia type: general  Patient location: PACU  Post pain: Pain level controlled  Post assessment: Patient's Cardiovascular Status Stable  Last Vitals:  Filed Vitals:   05/02/12 1500  BP: 124/69  Pulse: 79  Temp: 36.8 C  Resp: 18    Post vital signs: Reviewed and stable  Level of consciousness: sedated  Complications: No apparent anesthesia complications

## 2012-05-02 NOTE — Transfer of Care (Signed)
Immediate Anesthesia Transfer of Care Note  Patient: Patrick Hamilton  Procedure(s) Performed: Procedure(s) (LRB) with comments: LUMBAR LAMINECTOMY/DECOMPRESSION MICRODISCECTOMY (Right) - lumbar five-sacral one laminotomy and disc excision right side  Patient Location: PACU  Anesthesia Type:General  Level of Consciousness: awake, alert  and oriented  Airway & Oxygen Therapy: Patient Spontanous Breathing and Patient connected to nasal cannula oxygen  Post-op Assessment: Report given to PACU RN and Post -op Vital signs reviewed and stable  Post vital signs: Reviewed and stable  Complications: No apparent anesthesia complications

## 2012-05-02 NOTE — Op Note (Signed)
Dictation [phone]

## 2012-05-02 NOTE — Anesthesia Preprocedure Evaluation (Addendum)
Anesthesia Evaluation  Patient identified by MRN, date of birth, ID band Patient awake    Reviewed: Allergy & Precautions, H&P , NPO status , Patient's Chart, lab work & pertinent test results, reviewed documented beta blocker date and time   History of Anesthesia Complications Negative for: history of anesthetic complications  Airway Mallampati: II TM Distance: >3 FB Neck ROM: full    Dental  (+) Teeth Intact and Dental Advisory Given,    Pulmonary neg pulmonary ROS,  breath sounds clear to auscultation        Cardiovascular Exercise Tolerance: Good negative cardio ROS  Rhythm:regular     Neuro/Psych PSYCHIATRIC DISORDERS Depression negative neurological ROS     GI/Hepatic negative GI ROS, (+)     substance abuse  alcohol use,   Endo/Other  Hypothyroidism   Renal/GU negative Renal ROS  negative genitourinary   Musculoskeletal   Abdominal   Peds  Hematology negative hematology ROS (+)   Anesthesia Other Findings See surgeon's H&P   Reproductive/Obstetrics negative OB ROS                          Anesthesia Physical Anesthesia Plan  ASA: III  Anesthesia Plan: General   Post-op Pain Management:    Induction: Intravenous  Airway Management Planned: Oral ETT  Additional Equipment:   Intra-op Plan:   Post-operative Plan: Extubation in OR  Informed Consent: I have reviewed the patients History and Physical, chart, labs and discussed the procedure including the risks, benefits and alternatives for the proposed anesthesia with the patient or authorized representative who has indicated his/her understanding and acceptance.   Dental Advisory Given  Plan Discussed with: CRNA and Surgeon  Anesthesia Plan Comments:         Anesthesia Quick Evaluation

## 2012-05-02 NOTE — Preoperative (Signed)
Beta Blockers   Reason not to administer Beta Blockers:Not Applicable 

## 2012-05-03 ENCOUNTER — Encounter (HOSPITAL_COMMUNITY): Payer: Self-pay | Admitting: Orthopedic Surgery

## 2012-05-03 NOTE — Evaluation (Signed)
Physical Therapy Evaluation Patient Details Name: Patrick Hamilton MRN: [id number] DOB: [birth year] Today's Date: 05/03/2012 Time: 3086-5784 PT Time Calculation (min): 10 min  PT Assessment / Plan / Recommendation Clinical Impression  Pt is a 26 y/o male admitted s/p L5-S1 microdisectomy. Pt education on back precautions and back protection with mobility/hobbies completed. Pt modified independent to supervision at this time with no follow-up needs identified. Signing off.    PT Assessment  Patent does not need any further PT services    Follow Up Recommendations  No PT follow up    Does the patient have the potential to tolerate intense rehabilitation      Barriers to Discharge        Equipment Recommendations  None recommended by PT    Recommendations for Other Services     Frequency      Precautions / Restrictions Precautions Precautions: Back Precaution Booklet Issued: No Restrictions Weight Bearing Restrictions: No   Pertinent Vitals/Pain None      Mobility  Bed Mobility Bed Mobility: Rolling Right;Right Sidelying to Sit;Rolling Left;Sit to Sidelying Right Rolling Right: 5: Supervision Rolling Left: 5: Supervision Right Sidelying to Sit: 5: Supervision Sit to Sidelying Right: 5: Supervision Details for Bed Mobility Assistance: Verbal cues for sequence to protect back. Transfers Transfers: Sit to Stand;Stand to Sit Sit to Stand: 6: Modified independent (Device/Increase time) Stand to Sit: 6: Modified independent (Device/Increase time) Ambulation/Gait Ambulation/Gait Assistance: 6: Modified independent (Device/Increase time) Ambulation Distance (Feet): 200 Feet Assistive device: None Ambulation/Gait Assistance Details: Slight antalgic initially progressing to no issues with distance. Gait Pattern: Antalgic Stairs: Yes Stairs Assistance: 5: Supervision Stairs Assistance Details (indicate cue type and reason): Verbal cues for "up with good, down with bad" if  needed due to pain.  Stair Management Technique: One rail Left;Alternating pattern;Forwards Number of Stairs: 2  Wheelchair Mobility Wheelchair Mobility: No    Shoulder Instructions     Exercises     PT Diagnosis:    PT Problem List:   PT Treatment Interventions:     PT Goals    Visit Information  Last PT Received On: 05/03/12 Assistance Needed: +1    Subjective Data  Subjective: "My back is just tight." Patient Stated Goal: Go home.   Prior Functioning  Home Living Lives With: Family Available Help at Discharge: Family;Available 24 hours/day Type of Home: House Home Access: Stairs to enter [employer] of Steps: 5 Entrance Stairs-Rails: Left Home Layout: Two level Alternate Level Stairs-Number of Steps: 12 Alternate Level Stairs-Rails: Right Bathroom Toilet: Standard Home Adaptive Equipment: None Prior Function Level of Independence: Independent Able to Take Stairs?: Yes Driving: Yes Communication Communication: No difficulties    Cognition  Overall Cognitive Status: Appears within functional limits for tasks assessed/performed Arousal/Alertness: Awake/alert Orientation Level: Appears intact for tasks assessed Behavior During Session: [facility] for tasks performed    Extremity/Trunk Assessment Right Upper Extremity Assessment RUE ROM/Strength/Tone: Within functional levels RUE Sensation: WFL - Light Touch RUE Coordination: WFL - gross/fine motor Left Upper Extremity Assessment LUE ROM/Strength/Tone: Within functional levels LUE Sensation: WFL - Light Touch LUE Coordination: WFL - gross/fine motor Right Lower Extremity Assessment RLE ROM/Strength/Tone: Within functional levels RLE Sensation: WFL - Light Touch RLE Coordination: WFL - gross/fine motor Left Lower Extremity Assessment LLE ROM/Strength/Tone: Within functional levels LLE Sensation: WFL - Light Touch LLE Coordination: WFL - gross/fine motor Trunk Assessment Trunk Assessment: Normal     Balance Balance Balance Assessed: No  End of Session PT - End of Session Activity  Tolerance: Patient tolerated treatment well Patient left: in bed;with call bell/phone within reach;with family/visitor present Nurse Communication: Mobility status  GP     Patrick Hamilton 05/03/2012, 9:47 AM  05/03/2012 Patrick Hamilton, PT, DPT [phone]

## 2012-05-03 NOTE — Progress Notes (Signed)
Patrick Hamilton discharged Home per MD order.    Patrick Hamilton, Patrick Hamilton [facility]  Home Medication Instructions ZOX:[id number]   Printed on:05/03/12 1703  Medication Information                    SYNTHROID 100 MCG tablet Take 1 tablet (100 mcg total) by mouth daily.           HYDROcodone-acetaminophen (NORCO/VICODIN) 5-325 MG per tablet Take 1 tablet by mouth 3 (three) times daily as needed. For pain           cyclobenzaprine (FLEXERIL) 10 MG tablet Take 10 mg by mouth 3 (three) times daily as needed. For muscle spasms           Multiple Vitamin (MULTIVITAMIN WITH MINERALS) TABS Take 1 tablet by mouth daily.           ibuprofen (ADVIL,MOTRIN) 200 MG tablet Take 600 mg by mouth every 6 (six) hours as needed. For pain           B Complex-C (B-COMPLEX WITH VITAMIN C) tablet Take 1 tablet by mouth daily.           BIOTIN PO Take 1 tablet by mouth daily.             Patients skin is clean, dry and intact, no evidence of skin break down. IV site discontinued and catheter remains intact. Site without signs and symptoms of complications. Dressing and pressure applied.  Patient transporter by wheelchair with volunteer,  no distress noted upon discharge.  Patrick Hamilton 05/03/2012 5:03 PM

## 2012-05-03 NOTE — Discharge Summary (Signed)
Admitted: nov. 5, 2013  Discharged: Nov. 8, 2013  Discharge Dx: right L5-S1 disc protrusion - extruded fragment  Admitted through ER in extreme pain, making all movement (ambulation etc) almost impossible.  MRI in ER showed increase in size of an already large HNP at right L5-S1.  Surgery planned the following day but OR unable to accommodate (case bumped by highly emergent cases).  The L5-S1 disc excision accomplished yesterday without event.  He is comfortable and ambulatory.  He will return to the office in approx. 17 days.  He may shower.  Condition on discharge: improved, stable

## 2012-05-03 NOTE — Progress Notes (Addendum)
Referral received for SNF. No d/c needs identified; patient returned home with family.   CSW did not see patient prior to d/c.   Lorri Frederick. [place]  [phone]

## 2012-05-03 NOTE — Progress Notes (Signed)
Seen in room 5N09 this am with his mother present  VS's stable, afebrile  S: no c/o pain, very comfortable  O: lying comfortably supine in bed; dressing with negligible drainage, full strength bilateral foot/ankle dorsi/plantarflexion  A: ready for discharge, dramatically resolved right sciatica s/p disc excision  P: discharge; activity level, dressing, return appointment all discussed.  He has an adequate supply of hydrocodone and no need for Rx's

## 2012-05-03 NOTE — Op Note (Signed)
NAME:  KIOWA, HOLLAR NO.:  [id number]  MEDICAL RECORD NO.:  [id number]  LOCATION:  5N09C                        FACILITY:  [facility]  PHYSICIAN:  Nelda Severe, MD      DATE OF BIRTH:  Apr 23, 1986  DATE OF PROCEDURE:  05/02/2012 DATE OF DISCHARGE:                              OPERATIVE REPORT   PREOPERATIVE DIAGNOSIS:  Right L5-S1 disk herniation with severe right sciatic pain.  POSTPROCEDURE DIAGNOSIS:  Right L5-S1 disk herniation with severe right sciatic pain.  PROCEDURE:  Right L5-S1 laminotomy and disk excision.  OPERATIVE NOTE:  The patient was placed under general endotracheal anesthesia.  Sequential compression devices were placed on both lower extremities.  Intravenous cefazolin was administered for prophylaxis against infection.  He was positioned prone on a Jackson frame.  Care was taken to position the upper extremities so as to avoid hyperflexion and abduction of the shoulders.  The elbows were carefully positioned to avoid hyperflexion. The thighs, knees, shins, and ankles were supported on pillows.  A skin marker was placed over the site of the proposed incision that was judged to be L5-S1, just to the right of the midline.  The lumbar area was prepped with DuraPrep and draped in a rectangular fashion and the drapes secured with Ioban.  A time-out was held during which time the usual information was confirmed/discussed.  A spinal needle was placed to aid in correct positioning of skin incision.  Spinal needle was inserted at what turned out to be the level of the last mobile segment, L5-S1.  The skin was scored in line with the marking on the skin and into the dermis.  I then injected the subcutaneous and subdermal tissue with a mixture of 0.25% plain Marcaine and 1% lidocaine with epinephrine. Incision was then deepened through the subcutaneous fat to the thoracolumbar fascia.  A vertical incision was made in thoracolumbar fascia  approximately 5 mm to the right of midline.  Paraspinal muscles were elevated off the lamina of S1 and L5.  The spinous process of what proved to be L5 was notched and a Kocher placed in another cross-table lateral taken to confirm correct level.  A Taylor retractor was then placed lateral to the L5-S1 apophyseal joint.  The interlaminar space was cleared of soft tissue.  High-speed bur was used to thin out the lamina proximally into perform very minor medial facetectomy of the inferior articular process of L5, right.  I then used Karlin curettes to detach the ligamentum flavum from the upper edge of the 1st sacral lamina.  A cottonoid patty was then passed distally to protect the dura.  A small amount of sacral lamina was then removed with a Kerrison rongeur.  I then was able to place a nerve hook deep to the ligamentum flavum and elevated ligamentum flavum was removed piecemeal with a 4 mm Kerrison.  The S1 nerve root was not immediately identifiable, being deep to epidural fat and quite displaced.  I used a Dandy nerve hook and mobilize the contents of the spinal canal off the disk fragment which was in fact free in the canal, having burst through the posterior longitudinal ligament.  No sharp instruments were placed until I could see the S1 nerve root and identified.  I was able to hook at least 50% of the amount of herniated disk with a nerve hook, and then delivered it out of the wound with a pituitary rongeur.  The 1st piece was very large, approximately 7 mm x 15 mm.  Subsequently, I removed 3 or 4 other moderately large pieces, again hooked out from location in the spinal canal proximal to the disk space, and ultimately a few small pieces from the subligamentous area.  I then used Epstein curettes and angled ring curettes to try to loosen up some more disk in the disk space and delivered a small amount.  Disk space was palpated with the ball-tipped nerve hook and felt a  fairly empty actually.  The floor of the canal was carefully palpated with the ball-tipped nerve hook and no more free fragments were identified.  The nerve root, S1 right, was under no compression whatsoever.  The wound was thoroughly irrigated on several occasions.  We took the cross-table lateral radiograph of the #4 Penfield probe in the L5-S1 disk space in order to confirm that we were operating at the intended and correct level.  We then injected the disk space and laminotomy with FloSeal.  Next, I injected 20 mL of Exparel into the thoracolumbar fascia bilaterally.  There was no bleeding really at all into the wound and therefore a drain was not placed.  The thoracolumbar fascia was closed using interrupted #1 Vicryl suture. The subcutaneous layer was closed using inverted interrupted 2-0 Vicryl sutures.  The skin was closed using a subcuticular 3-0 undyed Vicryl suture.  Skin edges were reinforced with Steri-Strips over  tincture of benzoin.  Mepilex dressing was applied.  There were no intraoperative complications.  The sponge and needle counts were correct.  BLOOD LOSS:  Considered negligible, less than 25 mL.  At the time of dictation, the patient has not yet been fully awakened and transferred to the recovery room, so no neurologic is reported here.     Nelda Severe, MD     MT/MEDQ  D:  05/02/2012  T:  05/03/2012  Job:  [id number]

## 2012-06-17 ENCOUNTER — Other Ambulatory Visit: Payer: Self-pay | Admitting: Internal Medicine

## 2013-05-01 ENCOUNTER — Other Ambulatory Visit: Payer: Self-pay

## 2013-11-08 IMAGING — CR DG LUMBAR SPINE 2-3V
3 series · 3 of 3 positions shown · non-contrast
Comparison: 04/30/2012

CLINICAL DATA: L5-S1 disc herniation, preop evaluation

LUMBAR SPINE - 2-3 VIEW

[t l-spine a.p.]
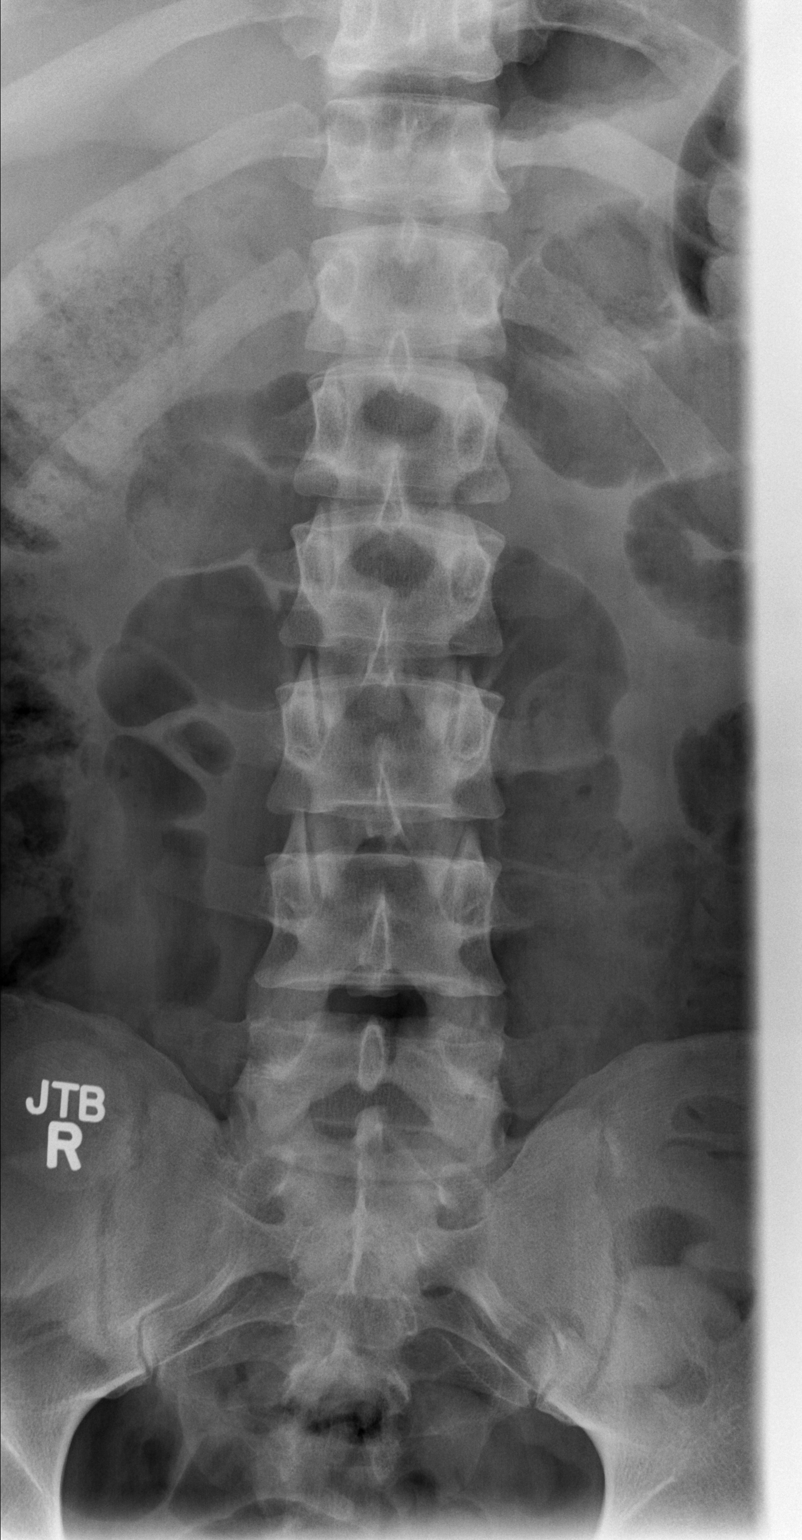

[t l-spine lat]
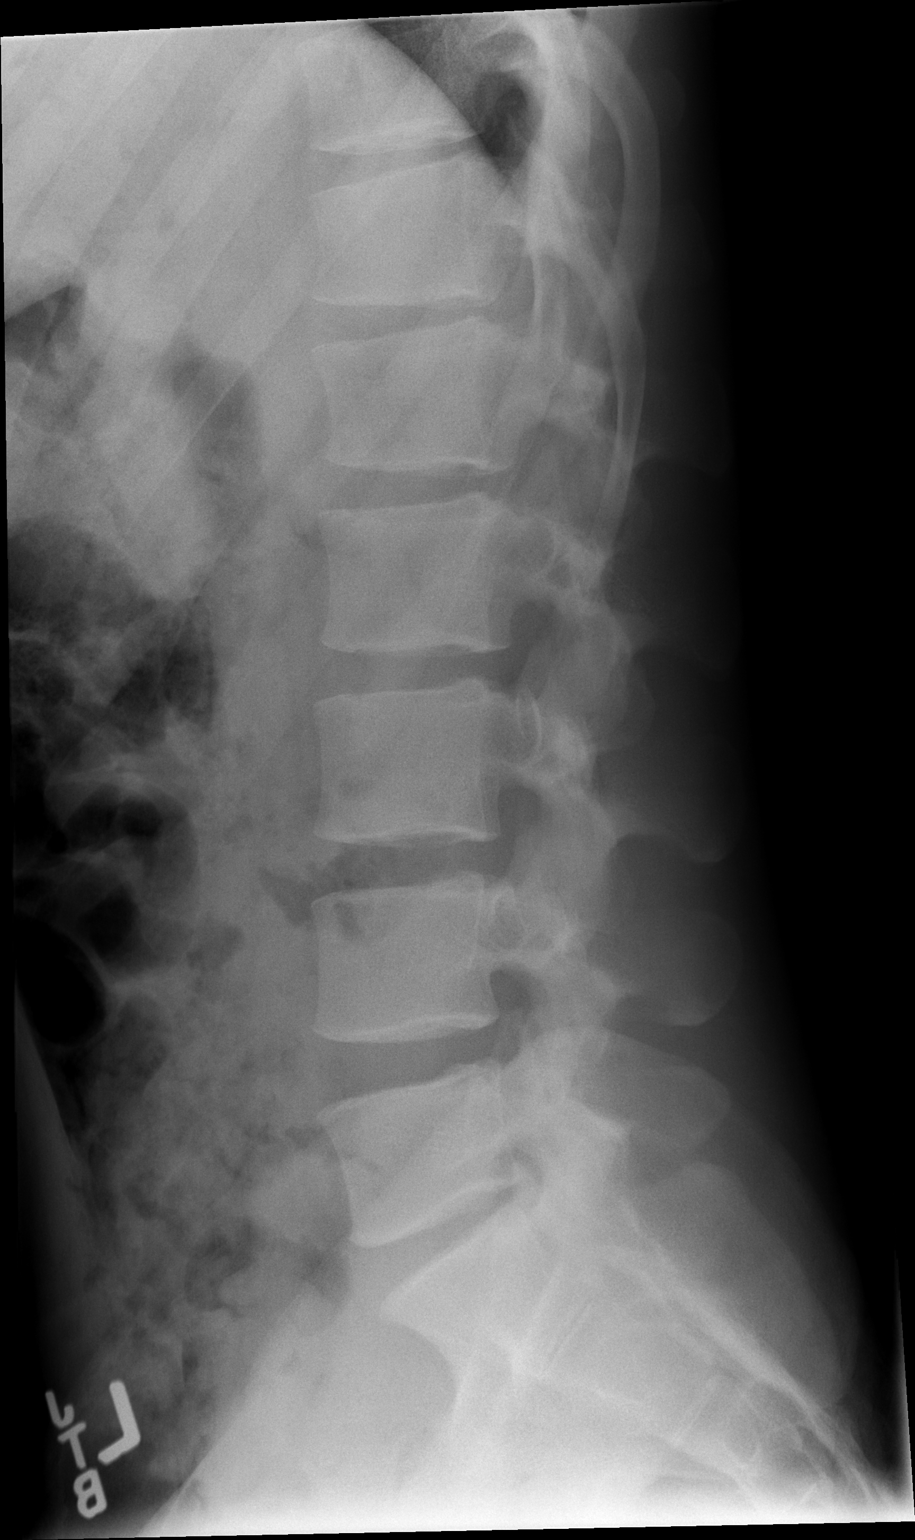

[t l-spine l5-s1 spot]
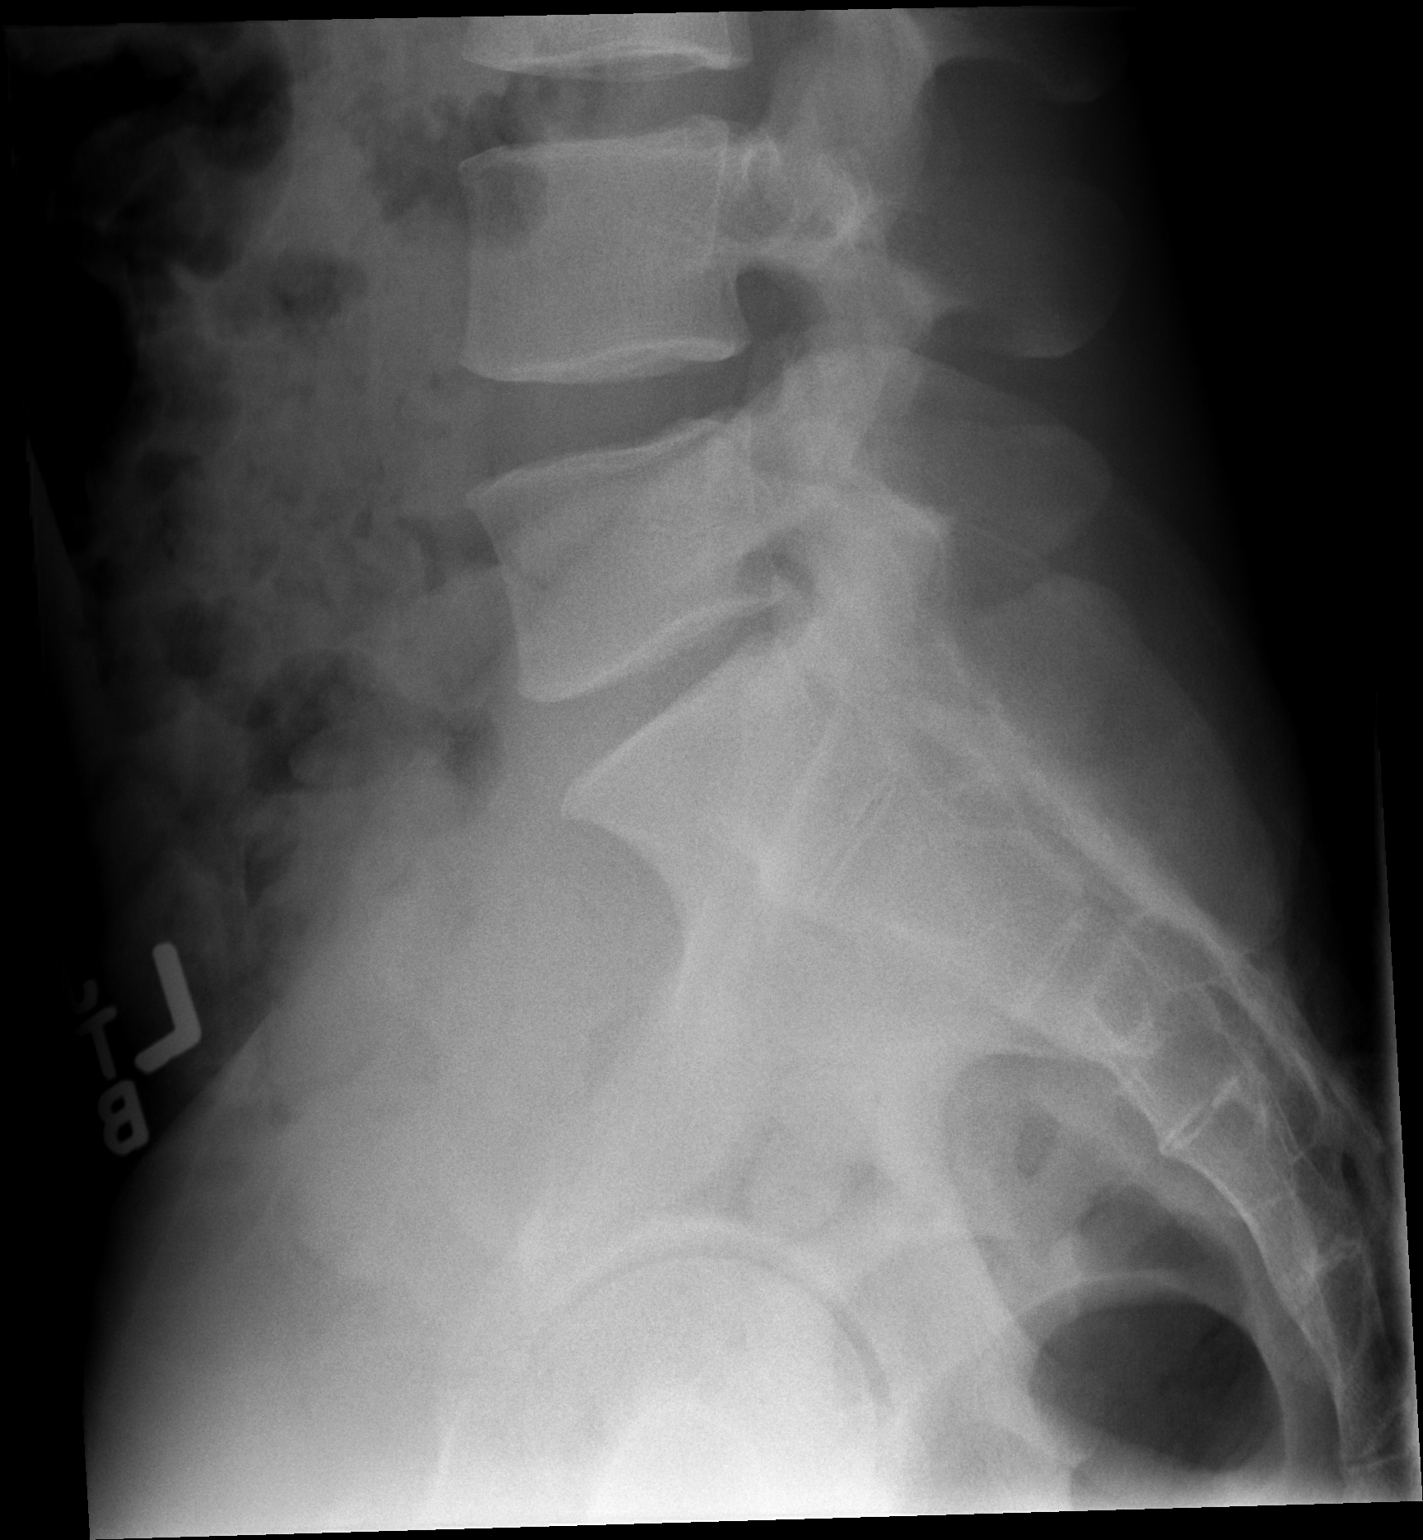

[3 of 3 positions shown; findings below may reference images not displayed]

FINDINGS: Normal alignment.  Preserved vertebral body heights.
Minor disc space narrowing at L5 S1.  Facets aligned.  Normal
pedicles and SI joints.  Nonobstructive bowel gas pattern.
IMPRESSION: Minor disc space narrowing at L5 S1.

## 2013-11-09 IMAGING — CR DG LUMBAR SPINE 2-3V
1 series · 1 of 1 positions shown · non-contrast
Comparison: Radiographs dated 05/01/2012

CLINICAL DATA: Lumbar disc protrusion.

LUMBAR SPINE - 2-3 VIEW

[view not recorded]
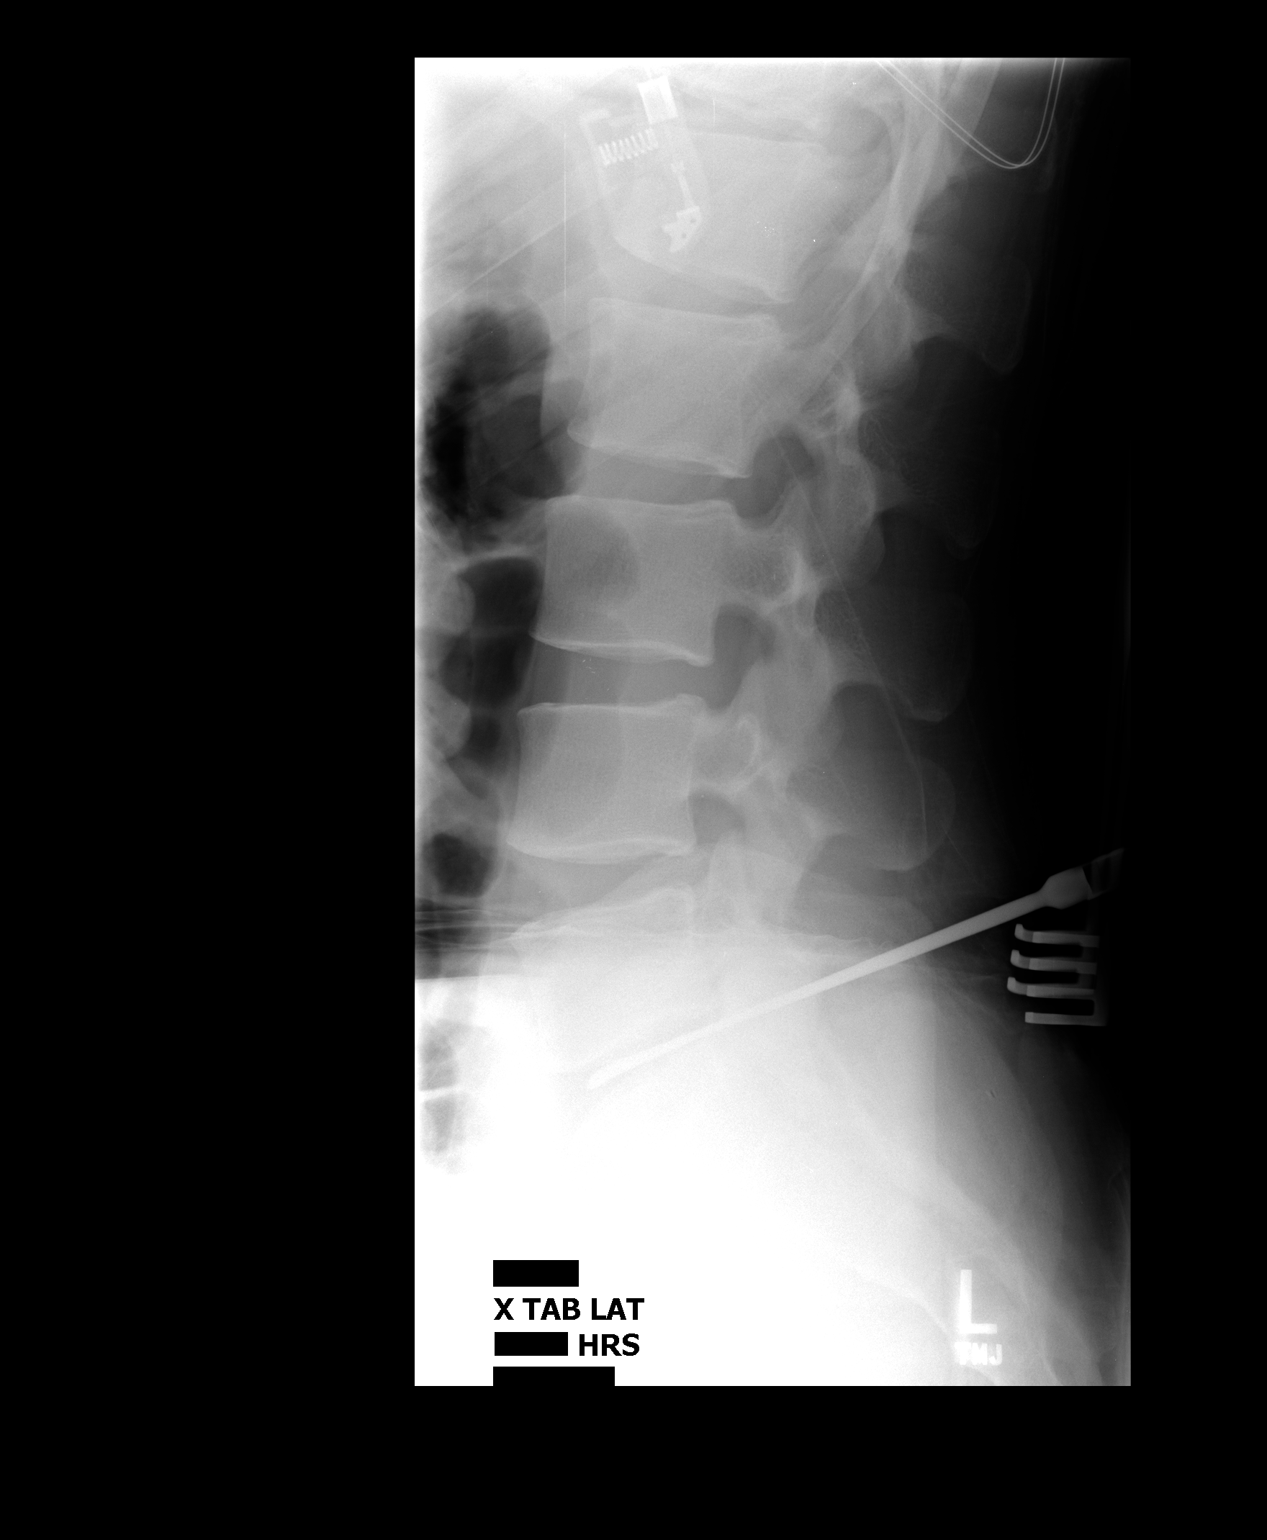

[1 of 1 positions shown; findings below may reference images not displayed]

FINDINGS: There is #1 demonstrates a needle at the L5-S1 level.
Radiograph #2 demonstrates a clamp on the spinous process of L5.
Radiograph #3 demonstrates an instrument in the L5-S1 disc space.
IMPRESSION: Instruments at L5-S1.

## 2014-11-03 ENCOUNTER — Telehealth: Payer: Self-pay | Admitting: Internal Medicine

## 2014-11-03 NOTE — Telephone Encounter (Signed)
Needs at least a  tsh done ( ov reccomended)   ... For me to be able to rx    this . Since I have not been managing his condition.   Can he see primary care  In the in the interim?  Or urgent care  [place] there [place] that can check lab and rx in the meantime ?

## 2014-11-03 NOTE — Telephone Encounter (Signed)
Pt lives in [place] and has an appt to see new endrocrinologist in July 2016. Dr Sharl Makerr office will not refill synthoid due to pt needs an appointment. Pt last seen dr Fabian Sharppanosh sept 2013. Pt needs new rx synthroid 100 mg #30 with 2 refills. [facility] [address] [place] phone [phone]

## 2014-11-04 NOTE — Telephone Encounter (Signed)
Spoke to patient's mother and he is scheduled for 10:45 on 11/05/14 to see WP.

## 2014-11-04 NOTE — Telephone Encounter (Signed)
Pt did try the UC, but they will not prescribe any meds w/out records faxed.  Mom states pt is 4 hrs away. Will drive down if needs to.  pls call if you have a minute.

## 2014-11-05 ENCOUNTER — Ambulatory Visit: Payer: Self-pay | Admitting: Internal Medicine

## 2017-03-16 ENCOUNTER — Encounter: Payer: Self-pay | Admitting: Internal Medicine

## 2018-09-06 DIAGNOSIS — E039 Hypothyroidism, unspecified: Secondary | ICD-10-CM | POA: Diagnosis not present

## 2018-12-09 DIAGNOSIS — E039 Hypothyroidism, unspecified: Secondary | ICD-10-CM | POA: Diagnosis not present

## 2019-03-05 DIAGNOSIS — E039 Hypothyroidism, unspecified: Secondary | ICD-10-CM | POA: Diagnosis not present

## 2019-03-31 DIAGNOSIS — L659 Nonscarring hair loss, unspecified: Secondary | ICD-10-CM | POA: Diagnosis not present

## 2019-03-31 DIAGNOSIS — Z23 Encounter for immunization: Secondary | ICD-10-CM | POA: Diagnosis not present

## 2019-03-31 DIAGNOSIS — E039 Hypothyroidism, unspecified: Secondary | ICD-10-CM | POA: Diagnosis not present

## 2019-03-31 DIAGNOSIS — N529 Male erectile dysfunction, unspecified: Secondary | ICD-10-CM | POA: Diagnosis not present

## 2019-06-17 DIAGNOSIS — Z139 Encounter for screening, unspecified: Secondary | ICD-10-CM | POA: Diagnosis not present

## 2019-10-10 ENCOUNTER — Ambulatory Visit (INDEPENDENT_AMBULATORY_CARE_PROVIDER_SITE_OTHER): Payer: BC Managed Care – PPO | Admitting: Internal Medicine

## 2019-10-10 ENCOUNTER — Other Ambulatory Visit: Payer: Self-pay

## 2019-10-10 ENCOUNTER — Encounter: Payer: Self-pay | Admitting: Internal Medicine

## 2019-10-10 VITALS — BP 140/78 | HR 103 | Temp 98.6°F | Ht 73.5 in | Wt 192.6 lb

## 2019-10-10 DIAGNOSIS — Z114 Encounter for screening for human immunodeficiency virus [HIV]: Secondary | ICD-10-CM

## 2019-10-10 DIAGNOSIS — Z113 Encounter for screening for infections with a predominantly sexual mode of transmission: Secondary | ICD-10-CM

## 2019-10-10 DIAGNOSIS — E038 Other specified hypothyroidism: Secondary | ICD-10-CM

## 2019-10-10 DIAGNOSIS — E039 Hypothyroidism, unspecified: Secondary | ICD-10-CM

## 2019-10-10 DIAGNOSIS — Z79899 Other long term (current) drug therapy: Secondary | ICD-10-CM | POA: Diagnosis not present

## 2019-10-10 DIAGNOSIS — F1010 Alcohol abuse, uncomplicated: Secondary | ICD-10-CM | POA: Diagnosis not present

## 2019-10-10 MED ORDER — LORAZEPAM 1 MG PO TABS: ORAL_TABLET | ORAL | 0 refills | Status: DC

## 2019-10-10 MED ORDER — SYNTHROID 100 MCG PO TABS: 100.0000 ug | ORAL_TABLET | Freq: Every day | ORAL | 1 refills | Status: DC

## 2019-10-10 NOTE — Patient Instructions (Signed)
Some of your sx caould be from alcohol withdrawal  Plus other  BP repeat was  140/78   Thiamine 100 mg  Per day for at least 3 days and  vitamin complex daily  Lorazepam  rx for withdrawal mild   NO alcohol  Day 1  2 mg every 6 hours  Day 2 2 mg every 8 hours Day 3 2 mg every 12 hours Day 4 2 mg at night      Can taper to 1 mg at night  Plan referral to  Psychiatry  To help with depression and counseling on alcohol  [facility]    Will do a referral   But  You may need to call for appt.  Plan follow up     Alcohol Use Disorder Alcohol use disorder is when your drinking disrupts your daily life. When you have this condition, you drink too much alcohol and you cannot control your drinking. Alcohol use disorder can cause serious problems with your physical health. It can affect your brain, heart, liver, pancreas, immune system, stomach, and intestines. Alcohol use disorder can increase your risk for certain cancers and cause problems with your mental health, such as depression, anxiety, psychosis, delirium, and dementia. People with this disorder risk hurting themselves and others. What are the causes? This condition is caused by drinking too much alcohol over time. It is not caused by drinking too much alcohol only one or two times. Some people with this condition drink alcohol to cope with or escape from negative life events. Others drink to relieve pain or symptoms of mental illness. What increases the risk? You are more likely to develop this condition if:  You have a family history of alcohol use disorder.  Your culture encourages drinking to the point of intoxication, or makes alcohol easy to get.  You had a mood or conduct disorder in childhood.  You have been a victim of abuse.  You are an adolescent and: ? You have poor grades or difficulties in school. ? Your caregivers do not talk to you about saying no to alcohol, or supervise  your activities. ? You are impulsive or you have trouble with self-control. What are the signs or symptoms? Symptoms of this condition include:  Drinkingmore than you want to.  Drinking for longer than you want to.  Trying several times to drink less or to control your drinking.  Spending a lot of time getting alcohol, drinking, or recovering from drinking.  Craving alcohol.  Having problems at work, at school, or at home due to drinking.  Having problems in relationships due to drinking.  Drinking when it is dangerous to drink, such as before driving a car.  Continuing to drink even though you know you might have a physical or mental problem related to drinking.  Needing more and more alcohol to get the same effect you want from the alcohol (building up tolerance).  Having symptoms of withdrawal when you stop drinking. Symptoms of withdrawal include: ? Fatigue. ? Nightmares. ? Trouble sleeping. ? Depression. ? Anxiety. ? Fever. ? Seizures. ? Severe confusion. ? Feeling or seeing things that are not there (hallucinations). ? Tremors. ? Rapid heart rate. ? Rapid breathing. ? High blood pressure.  Drinking to avoid symptoms of withdrawal. How is this diagnosed? This condition is diagnosed with an assessment. Your health care provider may start the assessment by asking three or four questions about your drinking. Your health  care provider may perform a physical exam or do lab tests to see if you have physical problems resulting from alcohol use. She or he may refer you to a mental health professional for evaluation. How is this treated? Some people with alcohol use disorder are able to reduce their alcohol use to low-risk levels. Others need to completely quit drinking alcohol. When necessary, mental health professionals with specialized training in substance use treatment can help. Your health care provider can help you decide how severe your alcohol use disorder is and  what type of treatment you need. The following forms of treatment are available:  Detoxification. Detoxification involves quitting drinking and using prescription medicines within the first week to help lessen withdrawal symptoms. This treatment is important for people who have had withdrawal symptoms before and for heavy drinkers who are likely to have withdrawal symptoms. Alcohol withdrawal can be dangerous, and in severe cases, it can cause death. Detoxification may be provided in a home, community, or primary care setting, or in a hospital or substance use treatment facility.  Counseling. This treatment is also called talk therapy. It is provided by substance use treatment counselors. A counselor can address the reasons you use alcohol and suggest ways to keep you from drinking again or to prevent problem drinking. The goals of talk therapy are to: ? Find healthy activities and ways for you to cope with stress. ? Identify and avoid the things that trigger your alcohol use. ? Help you learn how to handle cravings.  Medicines.Medicines can help treat alcohol use disorder by: ? Decreasing alcohol cravings. ? Decreasing the positive feeling you have when you drink alcohol. ? Causing an uncomfortable physical reaction when you drink alcohol (aversion therapy).  Support groups. Support groups are led by people who have quit drinking. They provide emotional support, advice, and guidance. These forms of treatment are often combined. Some people with this condition benefit from a combination of treatments provided by specialized substance use treatment centers. Follow these instructions at home:  Take over-the-counter and prescription medicines only as told by your health care provider.  Check with your health care provider before starting any new medicines.  Ask friends and family members not to offer you alcohol.  Avoid situations where alcohol is served, including gatherings where others are  drinking alcohol.  Create a plan for what to do when you are tempted to use alcohol.  Find hobbies or activities that you enjoy that do not include alcohol.  Keep all follow-up visits as told by your health care provider. This is important. How is this prevented?  If you drink, limit alcohol intake to no more than 1 drink a day for nonpregnant women and 2 drinks a day for men. One drink equals 12 oz of beer, 5 oz of wine, or 1 oz of hard liquor.  If you have a mental health condition, get treatment and support.  Do not give alcohol to adolescents.  If you are an adolescent: ? Do not drink alcohol. ? Do not be afraid to say no if someone offers you alcohol. Speak up about why you do not want to drink. You can be a positive role model for your friends and set a good example for those around you by not drinking alcohol. ? If your friends drink, spend time with others who do not drink alcohol. Make new friends who do not use alcohol. ? Find healthy ways to manage stress and emotions, such as meditation or deep breathing,  exercise, spending time in nature, listening to music, or talking with a trusted friend or family member. Contact a health care provider if:  You are not able to take your medicines as told.  Your symptoms get worse.  You return to drinking alcohol (relapse) and your symptoms get worse. Get help right away if:  You have thoughts about hurting yourself or others. If you ever feel like you may hurt yourself or others, or have thoughts about taking your own life, get help right away. You can go to your nearest emergency department or call:  Your local emergency services (911 in the [place]  A suicide crisis helpline, such as the National Suicide Prevention Lifeline at 4406530392. This is open 24 hours a day. Summary  Alcohol use disorder is when your drinking disrupts your daily life. When you have this condition, you drink too much alcohol and you cannot control  your drinking.  Treatment may include detoxification, counseling, medicine, and support groups.  Ask friends and family members not to offer you alcohol. Avoid situations where alcohol is served.  Get help right away if you have thoughts about hurting yourself or others. This information is not intended to replace advice given to you by your health care provider. Make sure you discuss any questions you have with your health care provider. Document Revised: 05/25/2017 Document Reviewed: 03/09/2016 [employer] Patient Education  2020 [employer].

## 2019-10-10 NOTE — Progress Notes (Signed)
Chief Complaint  Patient presents with  . Alcohol Problem    Discuss behavior/mood and alcohol abuse    HPI: Patrick Hamilton 34 y.o. come in for help with mom to reestablish  Concern about etoh and  Mom reports ? Possible bipolar?  [place]  Let go from job March 25 for other reasons.   Parents had been worried aobut how he was doing.  Tues am April `13 had a suddent  Fear and  panic  Heard and evil works    Terror   [employer] Corporation[employer]aw  Himself  a being killed . In apt   So ran away.  Drove an hours and 15 minutes . Called father to help.  Reported that he "is an alcoholic"  and had been drinking every day since  since November until Monday am    Then depression  Began also  Was terminated  Job March 25   Not sleeping since then.  Patrick Hamilton  Was Drinking in  Am work days  .  Rum and tequila same increase     About 1/5 per day. Take a rest get to work   [employer]  Last one  December 25  Had one episodes o f vomiting blood .  No hx of known seizures   Monday .  7 am. Was last drink April 12  sicne he has been home no to little sleep  Parent gavei him an Ambien to try to get 2 hours .   Is  stinulated up hyper  Mild tremor  No more hallucinations or  Hearing compelling voice  Taking thyroid med but our for a few days  denis any other subtance use  Etc  Began drinking some iage 18  And often but not like the past  6 months  Has depressive sx but feels much safer  Now with family    ROS: See pertinent positives and negatives per HPI. Hx of black outs dec, no other   Past Medical History:  Diagnosis Date  . Acne   . Allergic rhinitis   . Compartment syndrome (HCC) 2010   right leg  . Depression   . Hypothyroidism   . Sever's disease     Family History  Problem Relation Age of Onset  . Prostate cancer Father [id number]  . Autism Brother   . Diabetes Other   . Allergies Other   . Alcohol abuse Other        mom's side of the family    Social History   Socioeconomic History  .  Marital status: Single    Spouse name: Not on file  . Number of children: Not on file  . Years of education: Not on file  . Highest education level: Not on file  Occupational History  . Not on file  Tobacco Use  . Smoking status: Former Smoker    Types: Cigarettes    Quit date: 09/26/2019    Years since quitting: 0.0  . Smokeless tobacco: Former NeurosurgeonUser    Types: Snuff  . Tobacco comment: 04/30/2012 "smoked & used snuff maybe for 1 wk total"  Substance and Sexual Activity  . Alcohol use: Yes    Alcohol/week: 10.0 standard drinks    Types: 10 Shots of liquor per week    Comment: Drinks a 5th daily  . Drug use: No  . Sexual activity: Yes  Other Topics Concern  . 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 Difficulty of Paying Living Expenses:   Food Insecurity:   . Worried About Programme researcher, broadcasting/film/video in the Last Year:   . Barista in the Last Year:   Transportation Needs:   . Freight forwarder (Medical):   Marland Kitchen Lack of Transportation (Non-Medical):   Physical Activity:   . Days of Exercise per Week:   . Minutes of Exercise per Session:   Stress:   . Feeling of Stress :   Social Connections:   . Frequency of Communication with Friends and Family:   . Frequency of Social Gatherings with Friends and Family:   . Attends Religious Services:   . Active Member of Clubs or Organizations:   . Attends Banker Meetings:   Marland Kitchen Marital Status:     Outpatient Medications Prior to Visit  Medication Sig Dispense Refill  . diphenhydrAMINE (BENADRYL) 25 mg capsule Take 50 mg by mouth every 6 (six) hours as needed.    . finasteride (PROPECIA) 1 MG tablet Once a Day.    . ibuprofen (ADVIL,MOTRIN) 200 MG tablet Take 600 mg by mouth every 6 (six) hours  as needed. For pain    . melatonin 5 MG TABS Take 10 mg by mouth every 6 (six) hours.    . sildenafil (REVATIO) 20 MG tablet Take 60 mg by mouth as needed.    Marland Kitchen SYNTHROID 100 MCG tablet take 1 tablet by mouth once daily 90 each 3  . B Complex-C (B-COMPLEX WITH VITAMIN C) tablet Take 1 tablet by mouth daily.    Marland Kitchen BIOTIN PO Take 1 tablet by mouth daily.    . cyclobenzaprine (FLEXERIL) 10 MG tablet Take 10 mg by mouth 3 (three) times daily as needed. For muscle spasms    . HYDROcodone-acetaminophen (NORCO/VICODIN) 5-325 MG per tablet Take 1 tablet by mouth 3 (three) times daily as needed. For pain    . Multiple Vitamin (MULTIVITAMIN WITH MINERALS) TABS Take 1 tablet by mouth daily.     No facility-administered medications prior to visit.     EXAM:  BP 140/78   Pulse (!) 103   Temp 98.6 F (37 C) (Temporal)   Ht 6' 1.5" (1.867 m)   Wt 192 lb 9.6 oz (87.4 kg)   SpO2 98%   BMI 25.07 kg/m   Body mass index is 25.07 kg/m.  GENERAL: vitals reviewed and listed above, alert, oriented, appears well hydrated and in no acute distressnl speech somwhat activated fine trmor on exam but not obvious appears nl poss anxious  HEENT: atraumatic, conjunctiva  clear, no obvious abnormalities on inspection of external nose and ears OP : masked  NECK: no obvious masses on inspection palpation  LUNGS: clear to auscultation bilaterally, no wheezes, rales or rhonchi, good air movement CV: HRRR, nog or m  Pulse 104 no clubbing cyanosis or  peripheral edema nl cap refill  MS: moves all extremities without noticeable focal  abnormality PSYCH: pleasant and cooperative,  Neuro non focal no rigitiy  Fine  Tremor on    Position blance good and walking seems facil dtrs nl reflexia  No sweating  pawss scale 3-4  Lab Results  Component Value Date  WBC 6.6 04/30/2012   HGB 16.3 04/30/2012   HCT 48.0 04/30/2012   PLT 155 04/30/2012   GLUCOSE 102 (H) 04/30/2012   ALT 15 04/30/2012   AST 21 04/30/2012   NA 140  04/30/2012   K 3.9 04/30/2012   CL 102 04/30/2012   CREATININE 1.05 04/30/2012   BUN 15 04/30/2012   CO2 29 04/30/2012   TSH 1.59 03/11/2012   PSA 0.97 08/12/2011   INR 1.18 04/30/2012   BP Readings from Last 3 Encounters:  10/10/19 140/78  05/03/12 (!) 119/58  03/18/12 130/70    ASSESSMENT AND PLAN:  Discussed the following assessment and plan:  Alcohol abuse - misl WD sx  - Plan: Vitamin B1, Basic metabolic panel, CBC with Differential/Platelet, Folate, Hepatic function panel, T4, free, TSH, Vitamin B12, CANCELED: Basic metabolic panel, CANCELED: CBC with Differential/Platelet, CANCELED: Hepatic function panel, CANCELED: TSH, CANCELED: T4, free, CANCELED: Vitamin B12, CANCELED: Folate  Medication management - Plan: Vitamin B1, Basic metabolic panel, CBC with Differential/Platelet, Folate, Hepatic function panel, T4, free, TSH, Vitamin B12, CANCELED: Basic metabolic panel, CANCELED: CBC with Differential/Platelet, CANCELED: Hepatic function panel, CANCELED: TSH, CANCELED: T4, free, CANCELED: Vitamin B12, CANCELED: Folate  Subclinical hypothyroidism - refill med check tsh  - Plan: Vitamin B1, Basic metabolic panel, CBC with Differential/Platelet, Folate, Hepatic function panel, T4, free, TSH, Vitamin B12, CANCELED: Basic metabolic panel, CANCELED: CBC with Differential/Platelet, CANCELED: Hepatic function panel, CANCELED: TSH, CANCELED: T4, free, CANCELED: Vitamin B12, CANCELED: Folate  Screening for HIV (human immunodeficiency virus) - Plan: HIV Antibody (routine testing w rflx)  Routine screening for STI (sexually transmitted infection) - Plan: HIV Antibody (routine testing w rflx), RPR Hold on refill finasteride  Until follo wup Modest risk of WD  Day 4 days   Disc  Ativan  Taper over 5 days incase of wd and taking vits.  Benefit more than risk at this time  Maintain No etoh or other benzos  I advise seeing psychiatry and counselors   To help with  aud and  Underlying depression or  bipolar mood disorder   hallucinating  or completing  Impulse and fear could be from WD but other causes to define .  Plan 2 weeks fu and go from there Can refer to [facility] or other program as indicated  -Patient advised to return or notify health care team  if  new concerns arise. In interim   Patient Instructions  Some of your sx caould be from alcohol withdrawal  Plus other  BP repeat was  140/78   Thiamine 100 mg  Per day for at least 3 days and  vitamin complex daily  Lorazepam  rx for withdrawal mild   NO alcohol  Day 1  2 mg every 6 hours  Day 2 2 mg every 8 hours Day 3 2 mg every 12 hours Day 4 2 mg at night      Can taper to 1 mg at night  Plan referral to  Psychiatry  To help with depression and counseling on alcohol  Dependence   [facility]    Will do a referral   But  You may need to call for appt.  Plan follow up     Alcohol Use Disorder Alcohol use disorder is when your drinking disrupts your daily life. When you have this condition, you drink too much alcohol and you cannot control your drinking. Alcohol use disorder can cause serious problems with your physical health.  It can affect your brain, heart, liver, pancreas, immune system, stomach, and intestines. Alcohol use disorder can increase your risk for certain cancers and cause problems with your mental health, such as depression, anxiety, psychosis, delirium, and dementia. People with this disorder risk hurting themselves and others. What are the causes? This condition is caused by drinking too much alcohol over time. It is not caused by drinking too much alcohol only one or two times. Some people with this condition drink alcohol to cope with or escape from negative life events. Others drink to relieve pain or symptoms of mental illness. What increases the risk? You are more likely to develop this condition if:  You have a family history of alcohol use disorder.  Your  culture encourages drinking to the point of intoxication, or makes alcohol easy to get.  You had a mood or conduct disorder in childhood.  You have been a victim of abuse.  You are an adolescent and: ? You have poor grades or difficulties in school. ? Your caregivers do not talk to you about saying no to alcohol, or supervise your activities. ? You are impulsive or you have trouble with self-control. What are the signs or symptoms? Symptoms of this condition include:  Drinkingmore than you want to.  Drinking for longer than you want to.  Trying several times to drink less or to control your drinking.  Spending a lot of time getting alcohol, drinking, or recovering from drinking.  Craving alcohol.  Having problems at work, at school, or at home due to drinking.  Having problems in relationships due to drinking.  Drinking when it is dangerous to drink, such as before driving a car.  Continuing to drink even though you know you might have a physical or mental problem related to drinking.  Needing more and more alcohol to get the same effect you want from the alcohol (building up tolerance).  Having symptoms of withdrawal when you stop drinking. Symptoms of withdrawal include: ? Fatigue. ? Nightmares. ? Trouble sleeping. ? Depression. ? Anxiety. ? Fever. ? Seizures. ? Severe confusion. ? Feeling or seeing things that are not there (hallucinations). ? Tremors. ? Rapid heart rate. ? Rapid breathing. ? High blood pressure.  Drinking to avoid symptoms of withdrawal. How is this diagnosed? This condition is diagnosed with an assessment. Your health care provider may start the assessment by asking three or four questions about your drinking. Your health care provider may perform a physical exam or do lab tests to see if you have physical problems resulting from alcohol use. She or he may refer you to a mental health professional for evaluation. How is this treated? Some  people with alcohol use disorder are able to reduce their alcohol use to low-risk levels. Others need to completely quit drinking alcohol. When necessary, mental health professionals with specialized training in substance use treatment can help. Your health care provider can help you decide how severe your alcohol use disorder is and what type of treatment you need. The following forms of treatment are available:  Detoxification. Detoxification involves quitting drinking and using prescription medicines within the first week to help lessen withdrawal symptoms. This treatment is important for people who have had withdrawal symptoms before and for heavy drinkers who are likely to have withdrawal symptoms. Alcohol withdrawal can be dangerous, and in severe cases, it can cause death. Detoxification may be provided in a home, community, or primary care setting, or in a hospital or substance use treatment  facility.  Counseling. This treatment is also called talk therapy. It is provided by substance use treatment counselors. A counselor can address the reasons you use alcohol and suggest ways to keep you from drinking again or to prevent problem drinking. The goals of talk therapy are to: ? Find healthy activities and ways for you to cope with stress. ? Identify and avoid the things that trigger your alcohol use. ? Help you learn how to handle cravings.  Medicines.Medicines can help treat alcohol use disorder by: ? Decreasing alcohol cravings. ? Decreasing the positive feeling you have when you drink alcohol. ? Causing an uncomfortable physical reaction when you drink alcohol (aversion therapy).  Support groups. Support groups are led by people who have quit drinking. They provide emotional support, advice, and guidance. These forms of treatment are often combined. Some people with this condition benefit from a combination of treatments provided by specialized substance use treatment centers. Follow these  instructions at home:  Take over-the-counter and prescription medicines only as told by your health care provider.  Check with your health care provider before starting any new medicines.  Ask friends and family members not to offer you alcohol.  Avoid situations where alcohol is served, including gatherings where others are drinking alcohol.  Create a plan for what to do when you are tempted to use alcohol.  Find hobbies or activities that you enjoy that do not include alcohol.  Keep all follow-up visits as told by your health care provider. This is important. How is this prevented?  If you drink, limit alcohol intake to no more than 1 drink a day for nonpregnant women and 2 drinks a day for men. One drink equals 12 oz of beer, 5 oz of wine, or 1 oz of hard liquor.  If you have a mental health condition, get treatment and support.  Do not give alcohol to adolescents.  If you are an adolescent: ? Do not drink alcohol. ? Do not be afraid to say no if someone offers you alcohol. Speak up about why you do not want to drink. You can be a positive role model for your friends and set a good example for those around you by not drinking alcohol. ? If your friends drink, spend time with others who do not drink alcohol. Make new friends who do not use alcohol. ? Find healthy ways to manage stress and emotions, such as meditation or deep breathing, exercise, spending time in nature, listening to music, or talking with a trusted friend or family member. Contact a health care provider if:  You are not able to take your medicines as told.  Your symptoms get worse.  You return to drinking alcohol (relapse) and your symptoms get worse. Get help right away if:  You have thoughts about hurting yourself or others. If you ever feel like you may hurt yourself or others, or have thoughts about taking your own life, get help right away. You can go to your nearest emergency department or call:  Your  local emergency services (911 in the [place]  A suicide crisis helpline, such as the National Suicide Prevention Lifeline at 567 779 0709. This is open 24 hours a day. Summary  Alcohol use disorder is when your drinking disrupts your daily life. When you have this condition, you drink too much alcohol and you cannot control your drinking.  Treatment may include detoxification, counseling, medicine, and support groups.  Ask friends and family members not to offer you alcohol. Avoid situations  where alcohol is served.  Get help right away if you have thoughts about hurting yourself or others. This information is not intended to replace advice given to you by your health care provider. Make sure you discuss any questions you have with your health care provider. Document Revised: 05/25/2017 Document Reviewed: 03/09/2016 Elsevier Patient Education  2020 [employer].     Durand K. Laydon Martis M.D.

## 2019-10-11 ENCOUNTER — Ambulatory Visit: Payer: BC Managed Care – PPO | Attending: Internal Medicine

## 2019-10-11 DIAGNOSIS — Z23 Encounter for immunization: Secondary | ICD-10-CM

## 2019-10-11 NOTE — Progress Notes (Signed)
   Covid-19 Vaccination Clinic  Name:  Patrick Hamilton    MRN: [id number] DOB: [birth year]  10/11/2019  [name] was observed post Covid-19 immunization for 15 minutes without incident. He was provided with Vaccine Information Sheet and instruction to access the V-Safe system.   [name] was instructed to call 911 with any severe reactions post vaccine: Marland Kitchen Difficulty breathing  . Swelling of face and throat  . A fast heartbeat  . A bad rash all over body  . Dizziness and weakness   Immunizations Administered    Name Date Dose VIS Date Route   Pfizer COVID-19 Vaccine 10/11/2019  1:57 PM 0.3 mL 06/06/2019 Intramuscular   Manufacturer: [employer], [employer]   Lot: [id number]   NDC: [address]-4

## 2019-10-13 ENCOUNTER — Other Ambulatory Visit: Payer: Self-pay

## 2019-10-13 DIAGNOSIS — R7989 Other specified abnormal findings of blood chemistry: Secondary | ICD-10-CM

## 2019-10-13 DIAGNOSIS — R748 Abnormal levels of other serum enzymes: Secondary | ICD-10-CM

## 2019-10-13 DIAGNOSIS — Z1159 Encounter for screening for other viral diseases: Secondary | ICD-10-CM

## 2019-10-13 MED ORDER — GABAPENTIN 300 MG PO CAPS: 300.0000 mg | ORAL_CAPSULE | Freq: Every day | ORAL | 1 refills | Status: DC

## 2019-10-13 NOTE — Progress Notes (Signed)
Lab results in range except  liver panel  mildly off ( suspect from the alcolol use ) and thyroid mildly off but that could be from missing doses and  absproption   At this time stay off the alcohol  tmake sure taking thyroid med every day   Check lfts and tsh  hep c screen and hep b aby , Hep b ag  in  6 - 8 weeks

## 2019-10-13 NOTE — Telephone Encounter (Signed)
So  Lets try gabapentin 300 mg at night  Disp 30   We may increase tot dose  There are other options also to try   Also  In regard to psychiatry and counseling referral  You will have to call for appt   Yourself  But can tell them we are  Advising   Care  [email or web address] Phone: [phone]  Office Hours Monday-Friday 9am-5pm  [address], [address] [place], [place]  [id number]   [email or web address]  [address] [place], [place] [address]  Contact [phone] [address] for appointments [phone] Fax

## 2019-10-15 LAB — BASIC METABOLIC PANEL
BUN: 7 mg/dL (ref 7–25)
CO2: 27 mmol/L (ref 20–32)
Calcium: 10.4 mg/dL — ABNORMAL HIGH (ref 8.6–10.3)
Chloride: 96 mmol/L — ABNORMAL LOW (ref 98–110)
Creat: 1.17 mg/dL (ref 0.60–1.35)
Glucose, Bld: 101 mg/dL — ABNORMAL HIGH (ref 65–99)
Potassium: 3.9 mmol/L (ref 3.5–5.3)
Sodium: 137 mmol/L (ref 135–146)

## 2019-10-15 LAB — CBC WITH DIFFERENTIAL/PLATELET
Absolute Monocytes: 705 cells/uL (ref 200–950)
Basophils Absolute: 26 cells/uL (ref 0–200)
Basophils Relative: 0.3 %
Eosinophils Absolute: 17 cells/uL (ref 15–500)
Eosinophils Relative: 0.2 %
HCT: 46.1 % (ref 38.5–50.0)
Hemoglobin: 15.9 g/dL (ref 13.2–17.1)
Lymphs Abs: 1279 cells/uL (ref 850–3900)
MCH: 32.9 pg (ref 27.0–33.0)
MCHC: 34.5 g/dL (ref 32.0–36.0)
MCV: 95.2 fL (ref 80.0–100.0)
MPV: 12 fL (ref 7.5–12.5)
Monocytes Relative: 8.1 %
Neutro Abs: 6673 cells/uL (ref 1500–7800)
Neutrophils Relative %: 76.7 %
Platelets: 161 10*3/uL (ref 140–400)
RBC: 4.84 10*6/uL (ref 4.20–5.80)
RDW: 12.3 % (ref 11.0–15.0)
Total Lymphocyte: 14.7 %
WBC: 8.7 10*3/uL (ref 3.8–10.8)

## 2019-10-15 LAB — HEPATIC FUNCTION PANEL
AG Ratio: 1.9 (calc) (ref 1.0–2.5)
ALT: 47 U/L — ABNORMAL HIGH (ref 9–46)
AST: 62 U/L — ABNORMAL HIGH (ref 10–40)
Albumin: 5.2 g/dL — ABNORMAL HIGH (ref 3.6–5.1)
Alkaline phosphatase (APISO): 70 U/L (ref 36–130)
Bilirubin, Direct: 0.3 mg/dL — ABNORMAL HIGH (ref 0.0–0.2)
Globulin: 2.8 g/dL (calc) (ref 1.9–3.7)
Indirect Bilirubin: 0.8 mg/dL (calc) (ref 0.2–1.2)
Total Bilirubin: 1.1 mg/dL (ref 0.2–1.2)
Total Protein: 8 g/dL (ref 6.1–8.1)

## 2019-10-15 LAB — T4, FREE: Free T4: 1.7 ng/dL (ref 0.8–1.8)

## 2019-10-15 LAB — FOLATE: Folate: 21.9 ng/mL

## 2019-10-15 LAB — VITAMIN B1: Vitamin B1 (Thiamine): <6 nmol/L — ABNORMAL LOW (ref 8–30)

## 2019-10-15 LAB — TSH: TSH: 5.9 mIU/L — ABNORMAL HIGH (ref 0.40–4.50)

## 2019-10-15 LAB — RPR: RPR Ser Ql: NONREACTIVE

## 2019-10-15 LAB — VITAMIN B12: Vitamin B-12: 1209 pg/mL — ABNORMAL HIGH (ref 200–1100)

## 2019-10-15 LAB — HIV ANTIBODY (ROUTINE TESTING W REFLEX): HIV 1&2 Ab, 4th Generation: NONREACTIVE

## 2019-10-17 NOTE — Progress Notes (Signed)
B1 level  thiamine  is very low    please make sure getting equivalent of 100 mg per day  for now

## 2019-10-22 ENCOUNTER — Ambulatory Visit: Payer: Self-pay | Admitting: Psychologist

## 2019-10-24 ENCOUNTER — Other Ambulatory Visit: Payer: Self-pay

## 2019-10-24 NOTE — Progress Notes (Signed)
Chief Complaint  Patient presents with  . Follow-up    Doing a little better since last time    HPI: Patrick Hamilton 34 y.o. come in for fu   etoh  Abuse disorder and other   Issues sleep and  Thyroid [facility] No etoh  Tool lorazepam using gabapentin   500 mg equivalent  Som help  Used parents ambien x 1 when couldn't  sleep  No more  Delusion or visuals panic   Feels safe  Father helped moved his belongings  s back to [employer] back on finasteride  Taking b complex b1 and b12  Allegra  An melatonin   ROS: See pertinent positives and negatives per HPI. No cp osb has left med  Knee pain when getting back to running  No injury   Past Medical History:  Diagnosis Date  . Acne   . Allergic rhinitis   . Compartment syndrome (HCC) 2010   right leg  . Depression   . Hypothyroidism   . Sever's disease     Family History  Problem Relation Age of Onset  . Prostate cancer Father 28  . Autism Brother   . Diabetes Other   . Allergies Other   . Alcohol abuse Other        mom's side of the family    Social History   Socioeconomic History  . Marital status: Single    Spouse name: Not on file  . Number of children: Not on file  . Years of education: Not on file  . Highest education level: Not on file  Occupational History  . Not on file  Tobacco Use  . Smoking status: Former Smoker    Types: Cigarettes    Quit date: 09/26/2019    Years since quitting: 0.0  . Smokeless tobacco: Former Neurosurgeon    Types: Snuff  . Tobacco comment: 04/30/2012 "smoked & used snuff maybe for 1 wk total"  Substance and Sexual Activity  . Alcohol use: Yes    Alcohol/week: 10.0 standard drinks    Types: 10 Shots of liquor per week    Comment: Drinks a 5th daily  . Drug use: No  . Sexual activity: Yes  Other Topics Concern  . 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 Difficulty of Paying Living Expenses:   Food Insecurity:   . Worried About Programme researcher, broadcasting/film/video in the Last Year:   . Barista in the Last Year:   Transportation Needs:   . Freight forwarder (Medical):   Marland Kitchen Lack of Transportation (Non-Medical):   Physical Activity:   . Days of Exercise per Week:   . Minutes of Exercise per Session:   Stress:   . Feeling of Stress :   Social Connections:   . Frequency of Communication with Friends and Family:   . Frequency of Social Gatherings with Friends and Family:   . Attends Religious Services:   . Active Member of Clubs or Organizations:   . Attends Banker Meetings:   Marland Kitchen Marital Status:     Outpatient Medications Prior to Visit  Medication Sig  Dispense Refill  . fexofenadine (ALLEGRA) 180 MG tablet Take 180 mg by mouth daily.    . finasteride (PROPECIA) 1 MG tablet Once a Day.    . ibuprofen (ADVIL,MOTRIN) 200 MG tablet Take 600 mg by mouth every 6 (six) hours as needed. For pain    . melatonin 5 MG TABS Take 10 mg by mouth every 6 (six) hours.    . Multiple Vitamin (MULTIVITAMIN WITH MINERALS) TABS Take 1 tablet by mouth daily.    Marland Kitchen pyridOXINE (VITAMIN B-6) 100 MG tablet Take 100 mg by mouth daily. [facility]    . SYNTHROID 100 MCG tablet Take 1 tablet (100 mcg total) by mouth daily. 90 tablet 1  . thiamine 100 MG tablet Take 100 mg by mouth daily. Vitamin B1 Nature Made    . vitamin B-12 (CYANOCOBALAMIN) 1000 MCG tablet Take 1,000 mcg by mouth daily. Vita Fusion Gummy 2 daily    . gabapentin (NEURONTIN) 300 MG capsule Take 1 capsule (300 mg total) by mouth at bedtime. (Patient taking differently: Take 500 mg by mouth at bedtime. ) 30 capsule 1  . B Complex-C (B-COMPLEX WITH VITAMIN C) tablet Take 1 tablet by mouth daily.    Marland Kitchen BIOTIN PO Take 1 tablet by mouth daily.    . cyclobenzaprine  (FLEXERIL) 10 MG tablet Take 10 mg by mouth 3 (three) times daily as needed. For muscle spasms    . diphenhydrAMINE (BENADRYL) 25 mg capsule Take 50 mg by mouth every 6 (six) hours as needed.    Marland Kitchen HYDROcodone-acetaminophen (NORCO/VICODIN) 5-325 MG per tablet Take 1 tablet by mouth 3 (three) times daily as needed. For pain    . LORazepam (ATIVAN) 1 MG tablet Day 1  2 mg every 6 hours Day 2 2 mg every 8 hours Day 3 2 mg every 12 hoursDay 4 2 mg at night . Can taper to 1 mg at night (Patient not taking: Reported on 10/27/2019) 30 tablet 0  . sildenafil (REVATIO) 20 MG tablet Take 60 mg by mouth as needed.     No facility-administered medications prior to visit.     EXAM:  BP 130/78   Pulse 67   Temp 98.2 F (36.8 C) (Temporal)   Ht 6' 1.5" (1.867 m)   Wt 203 lb 3.2 oz (92.2 kg)   SpO2 98%   BMI 26.45 kg/m   Body mass index is 26.45 kg/m.  GENERAL: vitals reviewed and listed above, alert, oriented, appears well hydrated and in no acute distress HEENT: atraumatic, conjunctiva  clear, no obvious abnormalities on inspection of external nose and ears non icteric  OP :  NECK: no obvious masses on inspection palpation  LUNGS: clear to auscultation bilaterally, no wheezes, rales or rhonchi, good air movement CV: HRRR, no clubbing cyanosis or  peripheral edema nl cap refill  Abdomen:  Sof,t normal bowel sounds without hepatosplenomegaly, no guarding rebound or masses no CVA tenderness MS: moves all extremities without noticeable focal  Abnormality r knee no effusion nl lm nl rom  PSYCH: pleasant and cooperative, no obvious depression or anxiety Lab Results  Component Value Date   WBC 8.7 10/10/2019   HGB 15.9 10/10/2019   HCT 46.1 10/10/2019   PLT 161 10/10/2019   GLUCOSE 101 (H) 10/10/2019   ALT 47 (H) 10/10/2019   AST 62 (H) 10/10/2019   NA 137 10/10/2019   K 3.9 10/10/2019   CL 96 (L) 10/10/2019   CREATININE 1.17 10/10/2019   BUN 7 10/10/2019  CO2 27 10/10/2019   TSH 5.90 (H)  10/10/2019   PSA 0.97 08/12/2011   INR 1.18 04/30/2012   BP Readings from Last 3 Encounters:  10/27/19 130/78  10/10/19 140/78  05/03/12 (!) 119/58    ASSESSMENT AND PLAN:  Discussed the following assessment and plan:  Sleep disturbance - rx for gabapentin 600 mg at night   Alcohol abuse - no eth since last visit   Elevated TSH  Elevated liver enzymes - Plan: Hep B Surface Antigen  Medication management  Subclinical hypothyroidism  Right knee pain, unspecified chronicity  continue  Knee avoid squats  Ice after activity and  Take rest days for running  Need hep b ag added to lab orders  Keep psych appt  Plan fu depnding on labs and how doing  Or about 2 mos  -Patient advised to return or notify health care team  if  new concerns arise.  Patient Instructions  increase the gabapentin  To 2 x 300 at night ( 600 mg )   Plan fu labs as discussed for thyroid and liver tests in June .   Stay alcohol free .   Your exam is good .   Plan follow up  Depending on lab tests results  Send in info about the b complex ingredients.         Neta Mends. Panosh M.D.

## 2019-10-27 ENCOUNTER — Other Ambulatory Visit: Payer: Self-pay

## 2019-10-27 ENCOUNTER — Encounter: Payer: Self-pay | Admitting: Internal Medicine

## 2019-10-27 ENCOUNTER — Ambulatory Visit (INDEPENDENT_AMBULATORY_CARE_PROVIDER_SITE_OTHER): Payer: BC Managed Care – PPO | Admitting: Internal Medicine

## 2019-10-27 VITALS — BP 130/78 | HR 67 | Temp 98.2°F | Ht 73.5 in | Wt 203.2 lb

## 2019-10-27 DIAGNOSIS — E038 Other specified hypothyroidism: Secondary | ICD-10-CM

## 2019-10-27 DIAGNOSIS — M25561 Pain in right knee: Secondary | ICD-10-CM

## 2019-10-27 DIAGNOSIS — F1010 Alcohol abuse, uncomplicated: Secondary | ICD-10-CM | POA: Diagnosis not present

## 2019-10-27 DIAGNOSIS — R748 Abnormal levels of other serum enzymes: Secondary | ICD-10-CM

## 2019-10-27 DIAGNOSIS — G479 Sleep disorder, unspecified: Secondary | ICD-10-CM

## 2019-10-27 DIAGNOSIS — Z79899 Other long term (current) drug therapy: Secondary | ICD-10-CM

## 2019-10-27 DIAGNOSIS — R7989 Other specified abnormal findings of blood chemistry: Secondary | ICD-10-CM | POA: Diagnosis not present

## 2019-10-27 DIAGNOSIS — E039 Hypothyroidism, unspecified: Secondary | ICD-10-CM

## 2019-10-27 MED ORDER — GABAPENTIN 300 MG PO CAPS: 600.0000 mg | ORAL_CAPSULE | Freq: Every day | ORAL | 3 refills | Status: DC

## 2019-10-27 NOTE — Patient Instructions (Signed)
increase the gabapentin  To 2 x 300 at night ( 600 mg )   Plan fu labs as discussed for thyroid and liver tests in June .   Stay alcohol free .   Your exam is good .   Plan follow up  Depending on lab tests results  Send in info about the b complex ingredients.

## 2019-10-30 ENCOUNTER — Other Ambulatory Visit: Payer: Self-pay

## 2019-10-30 ENCOUNTER — Encounter: Payer: Self-pay | Admitting: Psychiatry

## 2019-10-30 ENCOUNTER — Ambulatory Visit (INDEPENDENT_AMBULATORY_CARE_PROVIDER_SITE_OTHER): Payer: BC Managed Care – PPO | Admitting: Psychiatry

## 2019-10-30 DIAGNOSIS — F1021 Alcohol dependence, in remission: Secondary | ICD-10-CM | POA: Diagnosis not present

## 2019-10-30 DIAGNOSIS — F4010 Social phobia, unspecified: Secondary | ICD-10-CM | POA: Diagnosis not present

## 2019-10-30 NOTE — Progress Notes (Signed)
[facility] MD/PA/NP Initial Note  11/01/2019 4:43 PM Patrick Hamilton  MRN:  [id number]  Chief Complaint:  Chief Complaint    Anxiety; Alcohol Problem      HPI: Pt is a 42 to male being seen for initial evaluation for anxiety, ETOH use, and recent depression. He is referred by his PCP, Dr. Regis Bill.   Pt reports that on April 14th he was in [place] and was getting on the highway and "all this fear came over me." He reports hearing an "evil laugh in my head... started feeling all this terror" with chest tightness, stomach tightness, and shortness of breath. He reports that he had an image of being stabbed by someone if he returned to his apartment. He then continued driving in the direction of [place] and drove 500 miles to [place], [place] where his father met him. He reports that he felt paranoid throughout the drive to [place] and has not experienced paranoia since that time.   He reports that on March 25th he was terminated from his job for having an inappropriate relationship with another employee that was a subordinate. He reports that he had major depression after he lost his job and had SI with thoughts of stabbing himself in the stomach. He reports that he was not eating or sleeping. "I just couldn't sleep." He reports that he took #20 Melatonin to go to sleep and higher amounts of Benadryl and continued to have difficulty sleeping. He reports that he was socially withdrawn during that time and not responding to calls or texts. He reports that he was drinking excessively. He reports that he was disorganized and not keeping track of personal bills or taking medication for thyroid d/o.   He reports that from November until April 12th he was drinking a fifth of liquor daily. Reports prior to that time he was drinking only on weekends. Last drink was 10/06/19. He reports that he stopped drinking abruptly. Denies cravings to use ETOH. He reports that he had first major depressive episode in 2011 when he was in  school at [employer]. He reports that his ETOH was also increased at that time.   Denies any panic attacks since October 08, 2019. He reports that he has had some anxiety in social situations when he was younger. He reports that he no longer has significant anxiety. Reports that he would drink at times to be able socialize. Some anxiety about running into someone he knows because he may be asked about job situation. He reports that he tends to "get overwhelmed" easily. Some catastrophic thinking. Had intrusive thought on 10/08/19. Denies any other intrusive thoughts. Denies any further physical s/s with anxiety. He reports that he enjoys a routine. Denies any checking behaviors, routines, or rituals.  Describes mood as "ok. There are some days that I am sad." Denies persistent sad mood. Denies irritability. Denies current anxiety. Reports that he he had trouble falling asleep last week and has been improved this week. He reports that he is sleeping about 6-8 hours a night on average. Had lost about 10 lbs in April and wt has returned to baseline. Energy has been good and is exercising daily. Motivation has been good. Concentration has been good for the last 2 weeks. Denies anhedonia. Denies SI.  Denies periods of decreased need for sleep. Denies increased energy or increased goal-directed activity. He reports that mood was elevated when he was drinking heavily. He reports that he had some impulsive and risky behaviors when drinking heavily.  More talkative when drinking. Denies excessive spending. Reports that he had anger issues when he was drinking.  He reports that he has a porn addiction and engaged in sexual activity at work with a subordinate. He started a relationship with her in July and they broke up several times and she told him she became pregnant twice while they were together and neither pregnancy was viable. Reports that porn addiction has been ongoing for many years. Reports that he has not watched porn  since April 12th.   Denies AH other than hearing "evil laugh" on April 14th. Denies VH. Reports that he had a visual of being stabbed. He reports that he had paranoia on drive home. He reports that he had some paranoia a few days after returning home. Denies any paranoia before or since that time.   Living with parents now and is starting to take care of himself and trying to get organized.   Born and raised in [place]. Has a younger brother who is 2 years younger and has autism. He reports that things were "good" growing up. Parents are married. He is currently living with them and his younger brother. 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 Later moved out of [place] and did not have anxiety then. Reports that he has not had any friends outside of work since 2011- "just ashamed." Worked for [employer]. Lived in [place] in 2015. Lived in [place], [place] and then [place] for work. Parents are supportive. Limited other supports. Enjoys being outside and likes golf and exercise. He reports that he may have had dyslexia and did not like reading out loud. Plans to start looking for work. Father retired in January and mother does not work.   Past Psychiatric Medication Trials: Ambien- Took x 1 Gabapentin- Started a few weeks ago. Has been helpful for sleep.  Xanax Lorazepam   Visit Diagnosis:    ICD-10-CM   1. ALCOHOL ABUSE, HX OF  F10.21   2. Social anxiety disorder  F40.10     Past Psychiatric History: Attended IOP/PHP at [facility] in 2011 for depression and saw an individual therapist during that time. Denies any other mental health tx to include therapy, medication management, or hospitalization.   Past Medical History:  Past Medical History:  Diagnosis Date  . Acne   . Allergic rhinitis   . Compartment syndrome ([facility]) 2010   right leg  . Depression   . Hypothyroidism   . Sever's disease     Past Surgical History:  Procedure Laterality Date   . compartment syndrome release  2010   RLE  . ED visit   2011   [place] for Alcohol related problem  . GROWTH PLATE SURGERY  ~ 3817   right foot  . LUMBAR LAMINECTOMY/DECOMPRESSION MICRODISCECTOMY  05/02/2012   Procedure: LUMBAR LAMINECTOMY/DECOMPRESSION MICRODISCECTOMY;  Surgeon: Lowella Grip, MD;  Location: [facility];  Service: Orthopedics;  Laterality: Right;  lumbar five-sacral one laminotomy and disc excision right side  . nose fracture    . RHINOPLASTY  1999  . TONSILLECTOMY AND ADENOIDECTOMY  2003    Family Psychiatric History: Reports that father had work related stress in the past.  Family History:  Family History  Problem Relation Age of Onset  . Prostate cancer Father 77  . Autism Brother   . Diabetes Other   . Allergies Other   . Alcohol abuse Other        mom's side of the family  .  Alcohol abuse Maternal Grandfather     Social History:  Social History   Socioeconomic History  . Marital status: Single    Spouse name: Not on file  . Number of children: Not on file  . Years of education: Not on file  . Highest education level: Not on file  Occupational History  . Not on file  Tobacco Use  . Smoking status: Former Smoker    Types: Cigarettes    Quit date: 09/26/2019    Years since quitting: 0.0  . Smokeless tobacco: Former Systems developer    Types: Snuff  . Tobacco comment: 04/30/2012 "smoked & used snuff maybe for 1 wk total"  Substance and Sexual Activity  . Alcohol use: Yes    Alcohol/week: 10.0 standard drinks    Types: 10 Shots of liquor per week    Comment: Drinks a 5th daily  . Drug use: No  . Sexual activity: Yes  Other Topics Concern  . 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 Difficulty of Paying Living Expenses:   Food  Insecurity:   . Worried About Charity fundraiser in the Last Year:   . Arboriculturist in the Last Year:   Transportation Needs:   . Film/video editor (Medical):   Marland Kitchen Lack of Transportation (Non-Medical):   Physical Activity:   . Days of Exercise per Week:   . Minutes of Exercise per Session:   Stress:   . Feeling of Stress :   Social Connections:   . Frequency of Communication with Friends and Family:   . Frequency of Social Gatherings with Friends and Family:   . Attends Religious Services:   . Active Member of Clubs or Organizations:   . Attends Archivist Meetings:   Marland Kitchen Marital Status:     Allergies: No Known Allergies  Metabolic Disorder Labs: No results found for: HGBA1C, MPG No results found for: PROLACTIN No results found for: CHOL, TRIG, HDL, CHOLHDL, VLDL, LDLCALC Lab Results  Component Value Date   TSH 5.90 (H) 10/10/2019   TSH 1.59 03/11/2012    Therapeutic Level Labs: No results found for: LITHIUM No results found for: VALPROATE No components found for:  CBMZ  Current Medications: Current Outpatient Medications  Medication Sig Dispense Refill  . fexofenadine (ALLEGRA) 180 MG tablet Take 180 mg by mouth daily.    . finasteride (PROPECIA) 1 MG tablet Once a Day.    . gabapentin (NEURONTIN) 300 MG capsule Take 2 capsules (600 mg total) by mouth at bedtime. 60 capsule 3  . melatonin 5 MG TABS Take 10 mg by mouth every 6 (six) hours.    . Multiple Vitamin (MULTIVITAMIN WITH MINERALS) TABS Take 1 tablet by mouth daily.    Marland Kitchen pyridOXINE (VITAMIN B-6) 100 MG tablet Take 100 mg by mouth daily. [facility]    . SYNTHROID 100 MCG tablet Take 1 tablet (100 mcg total) by mouth daily. 90 tablet 1  . thiamine 100 MG tablet Take 100 mg by mouth daily. Vitamin B1 Nature Made    . vitamin B-12 (CYANOCOBALAMIN) 1000 MCG tablet Take 1,000 mcg by mouth daily. Vita Fusion Gummy 2 daily    .  diphenhydrAMINE (BENADRYL) 25 mg capsule Take 50 mg by mouth every 6  (six) hours as needed.    Marland Kitchen ibuprofen (ADVIL,MOTRIN) 200 MG tablet Take 600 mg by mouth every 6 (six) hours as needed. For pain     No current facility-administered medications for this visit.    Medication Side Effects: none  Orders placed this visit:  No orders of the defined types were placed in this encounter.   Psychiatric Specialty Exam:  Review of Systems  There were no vitals taken for this visit.There is no height or weight on file to calculate BMI.  General Appearance: Casual and Neat  Eye Contact:  Good  Speech:  Clear and Coherent and Normal Rate  Volume:  Normal  Mood:  Appropriate to content  Affect:  Appropriate, Congruent and Constricted  Thought Process:  Coherent, Goal Directed, Linear and Descriptions of Associations: Intact  Orientation:  Full (Time, Place, and Person)  Thought Content: Logical and Hallucinations: None   Suicidal Thoughts:  No  Homicidal Thoughts:  No  Memory:  WNL  Judgement:  Good  Insight:  Good  Psychomotor Activity:  Normal  Concentration:  Concentration: Good and Attention Span: Good  Recall:  Good  Fund of Knowledge: Good  Language: Good  Assets:  Communication Skills Desire for Improvement Social Support  ADL's:  Intact  Cognition: WNL  Prognosis:  Good   Screenings:  GAD-7     Office Visit from 10/30/2019 in [facility]  Total GAD-7 Score  3    PHQ2-9     Office Visit from 10/30/2019 in [facility] Office Visit from 10/10/2019 in [place] [place]  PHQ-2 Total Score  1  4  PHQ-9 Total Score  4  16      Receiving Psychotherapy: No   Treatment Plan/Recommendations: Pt seen for 60 minutes and time spent discussing recent s/s and discussed that he most likely was experiencing acute withdrawal s/s on April 14th and when coupled with extreme sleep deprivation, likely resulted in psychotic s/s. Discussed that there has been a significant improvement in depressive s/s in a short  period of time based on his report and comparing today's results from PHQ-9 to results from 10/10/19. Discussed relationship between ETOH and depression and pt reports that both recently and in 2011-2012 he thinks that ETOH use increased and then he developed depressive s/s. Discussed that tx for ETOH dependence and support maintaining his sobriety would likely be most beneficial at this time. Discussed different types of tx to include IOP, meeting with a therapist/substance abuse counselor, and/or AA. Pt is amenable to therapy and referral made to Beckey Downing, [facility] [place]. Pt signed info release and case was reviewed with therapist. Encouraged pt to explore AA and to consider attending some virtual meetings. Discussed not starting additional medication at this time since he reports that he has minimal mood and anxiety s/s currently and has had significant improvement in these areas. Discussed f/u in 4 weeks to re-assess or sooner if clinically indicated. Patient advised to contact office with any questions, adverse effects, or acute worsening in signs and symptoms.    Thayer Headings, PMHNP

## 2019-10-30 NOTE — Patient Instructions (Signed)
Recommend contacting Bradley Ferris, LCAS, LPC for therapy. [phone]  [address] [place], [place] [address]

## 2019-11-03 ENCOUNTER — Ambulatory Visit: Payer: Self-pay | Attending: Internal Medicine

## 2019-11-03 ENCOUNTER — Other Ambulatory Visit: Payer: Self-pay

## 2019-11-03 DIAGNOSIS — Z23 Encounter for immunization: Secondary | ICD-10-CM

## 2019-11-03 DIAGNOSIS — M25561 Pain in right knee: Secondary | ICD-10-CM

## 2019-11-03 NOTE — Progress Notes (Signed)
   Covid-19 Vaccination Clinic  Name:  Patrick Hamilton    MRN: [id number] DOB: [birth year]  11/03/2019  [name] was observed post Covid-19 immunization for 15 minutes without incident. He was provided with Vaccine Information Sheet and instruction to access the V-Safe system.   [name] was instructed to call 911 with any severe reactions post vaccine: Marland Kitchen Difficulty breathing  . Swelling of face and throat  . A fast heartbeat  . A bad rash all over body  . Dizziness and weakness   Immunizations Administered    Name Date Dose VIS Date Route   Pfizer COVID-19 Vaccine 11/03/2019 10:29 AM 0.3 mL 08/20/2018 Intramuscular   Manufacturer: [employer], [employer]   Lot: [id number]   NDC: [address]-1

## 2019-11-03 NOTE — Telephone Encounter (Signed)
I advise  referral to sports medicine   about the right knee pain .  Please do referral

## 2019-11-04 ENCOUNTER — Telehealth: Payer: Self-pay | Admitting: Psychiatry

## 2019-11-04 NOTE — Telephone Encounter (Signed)
Patient called and said that he has been trying to contact therapist deb young for two weeks and has not been able to reach her. He needs to know what to do next. Please give him a call at [phone]

## 2019-11-09 ENCOUNTER — Other Ambulatory Visit: Payer: Self-pay

## 2019-11-09 ENCOUNTER — Emergency Department (HOSPITAL_COMMUNITY)
Admission: EM | Admit: 2019-11-09 | Discharge: 2019-11-09 | Disposition: A | Discharge status: Other Acute Inpatient Hospital | Payer: BC Managed Care – PPO | Source: Home / Self Care | Attending: Emergency Medicine | Admitting: Emergency Medicine

## 2019-11-09 ENCOUNTER — Encounter (HOSPITAL_COMMUNITY): Payer: Self-pay | Admitting: Emergency Medicine

## 2019-11-09 ENCOUNTER — Encounter (HOSPITAL_COMMUNITY): Payer: Self-pay | Admitting: Nurse Practitioner

## 2019-11-09 ENCOUNTER — Inpatient Hospital Stay (HOSPITAL_COMMUNITY)
Admission: AD | Admit: 2019-11-09 | Discharge: 2019-11-14 | DRG: 885 | Disposition: A | Discharge status: Home / Self Care | Payer: BC Managed Care – PPO | Source: Intra-hospital | Attending: Psychiatry | Admitting: Psychiatry

## 2019-11-09 DIAGNOSIS — Z87891 Personal history of nicotine dependence: Secondary | ICD-10-CM | POA: Insufficient documentation

## 2019-11-09 DIAGNOSIS — R45851 Suicidal ideations: Secondary | ICD-10-CM | POA: Diagnosis not present

## 2019-11-09 DIAGNOSIS — E039 Hypothyroidism, unspecified: Secondary | ICD-10-CM | POA: Diagnosis not present

## 2019-11-09 DIAGNOSIS — Z811 Family history of alcohol abuse and dependence: Secondary | ICD-10-CM | POA: Diagnosis not present

## 2019-11-09 DIAGNOSIS — F332 Major depressive disorder, recurrent severe without psychotic features: Principal | ICD-10-CM | POA: Diagnosis present

## 2019-11-09 DIAGNOSIS — G4700 Insomnia, unspecified: Secondary | ICD-10-CM | POA: Diagnosis not present

## 2019-11-09 DIAGNOSIS — Z7289 Other problems related to lifestyle: Secondary | ICD-10-CM

## 2019-11-09 DIAGNOSIS — Z79899 Other long term (current) drug therapy: Secondary | ICD-10-CM | POA: Diagnosis not present

## 2019-11-09 DIAGNOSIS — Z79890 Hormone replacement therapy (postmenopausal): Secondary | ICD-10-CM

## 2019-11-09 DIAGNOSIS — Z03818 Encounter for observation for suspected exposure to other biological agents ruled out: Secondary | ICD-10-CM | POA: Diagnosis not present

## 2019-11-09 DIAGNOSIS — F1020 Alcohol dependence, uncomplicated: Secondary | ICD-10-CM | POA: Insufficient documentation

## 2019-11-09 DIAGNOSIS — F419 Anxiety disorder, unspecified: Secondary | ICD-10-CM | POA: Diagnosis not present

## 2019-11-09 DIAGNOSIS — Z20822 Contact with and (suspected) exposure to covid-19: Secondary | ICD-10-CM | POA: Diagnosis not present

## 2019-11-09 DIAGNOSIS — Z833 Family history of diabetes mellitus: Secondary | ICD-10-CM | POA: Diagnosis not present

## 2019-11-09 DIAGNOSIS — F1024 Alcohol dependence with alcohol-induced mood disorder: Secondary | ICD-10-CM | POA: Diagnosis not present

## 2019-11-09 LAB — CBC
HCT: 44.7 % (ref 39.0–52.0)
Hemoglobin: 14.5 g/dL (ref 13.0–17.0)
MCH: 32.2 pg (ref 26.0–34.0)
MCHC: 32.4 g/dL (ref 30.0–36.0)
MCV: 99.1 fL (ref 80.0–100.0)
Platelets: 203 10*3/uL (ref 150–400)
RBC: 4.51 MIL/uL (ref 4.22–5.81)
RDW: 12.4 % (ref 11.5–15.5)
WBC: 6.8 10*3/uL (ref 4.0–10.5)
nRBC: 0 % (ref 0.0–0.2)

## 2019-11-09 LAB — COMPREHENSIVE METABOLIC PANEL
ALT: 24 U/L (ref 0–44)
AST: 25 U/L (ref 15–41)
Albumin: 4.2 g/dL (ref 3.5–5.0)
Alkaline Phosphatase: 49 U/L (ref 38–126)
Anion gap: 12 (ref 5–15)
BUN: 14 mg/dL (ref 6–20)
CO2: 25 mmol/L (ref 22–32)
Calcium: 8.9 mg/dL (ref 8.9–10.3)
Chloride: 103 mmol/L (ref 98–111)
Creatinine, Ser: 0.99 mg/dL (ref 0.61–1.24)
GFR calc Af Amer: >60 mL/min (ref >60)
GFR calc non Af Amer: >60 mL/min (ref >60)
Glucose, Bld: 86 mg/dL (ref 70–99)
Potassium: 4.1 mmol/L (ref 3.5–5.1)
Sodium: 140 mmol/L (ref 135–145)
Total Bilirubin: 0.6 mg/dL (ref 0.3–1.2)
Total Protein: 6.9 g/dL (ref 6.5–8.1)

## 2019-11-09 LAB — RAPID URINE DRUG SCREEN, HOSP PERFORMED
Amphetamines: NOT DETECTED
Barbiturates: NOT DETECTED
Benzodiazepines: NOT DETECTED
Cocaine: NOT DETECTED
Opiates: NOT DETECTED
Tetrahydrocannabinol: NOT DETECTED

## 2019-11-09 LAB — ETHANOL: Alcohol, Ethyl (B): <10 mg/dL (ref <10)

## 2019-11-09 LAB — SARS CORONAVIRUS 2 BY RT PCR (HOSPITAL ORDER, PERFORMED IN ~~LOC~~ HOSPITAL LAB): SARS Coronavirus 2: NEGATIVE

## 2019-11-09 LAB — ACETAMINOPHEN LEVEL: Acetaminophen (Tylenol), Serum: <10 ug/mL — ABNORMAL LOW (ref 10–30)

## 2019-11-09 LAB — SALICYLATE LEVEL: Salicylate Lvl: <7 mg/dL — ABNORMAL LOW (ref 7.0–30.0)

## 2019-11-09 MED ORDER — LEVOTHYROXINE SODIUM 100 MCG PO TABS
100.0000 ug | ORAL_TABLET | Freq: Every day | ORAL | Status: DC
  Administered 2019-11-10 – 2019-11-14 (×5): 100 ug via ORAL
  Filled 2019-11-09 (×8): qty 1

## 2019-11-09 MED ORDER — ALUM & MAG HYDROXIDE-SIMETH 200-200-20 MG/5ML PO SUSP: 30.0000 mL | ORAL | Status: DC | PRN

## 2019-11-09 MED ORDER — ACETAMINOPHEN 325 MG PO TABS
650.0000 mg | ORAL_TABLET | Freq: Four times a day (QID) | ORAL | Status: DC | PRN
  Administered 2019-11-10 – 2019-11-12 (×3): 650 mg via ORAL
  Filled 2019-11-09 (×3): qty 2

## 2019-11-09 MED ORDER — GABAPENTIN 300 MG PO CAPS
ORAL_CAPSULE | ORAL | Status: AC
  Filled 2019-11-09: qty 2

## 2019-11-09 MED ORDER — LORAZEPAM 1 MG PO TABS: 1.0000 mg | ORAL_TABLET | Freq: Four times a day (QID) | ORAL | Status: AC | PRN

## 2019-11-09 MED ORDER — ADULT MULTIVITAMIN W/MINERALS CH
1.0000 | ORAL_TABLET | Freq: Every day | ORAL | Status: DC
  Administered 2019-11-10 – 2019-11-14 (×5): 1 via ORAL
  Filled 2019-11-09 (×6): qty 1

## 2019-11-09 MED ORDER — HYDROXYZINE HCL 25 MG PO TABS
25.0000 mg | ORAL_TABLET | Freq: Three times a day (TID) | ORAL | Status: DC | PRN
  Administered 2019-11-11 – 2019-11-13 (×3): 25 mg via ORAL
  Filled 2019-11-09 (×3): qty 1

## 2019-11-09 MED ORDER — ADULT MULTIVITAMIN W/MINERALS CH
1.0000 | ORAL_TABLET | Freq: Every day | ORAL | Status: DC
  Filled 2019-11-09 (×2): qty 1

## 2019-11-09 MED ORDER — THIAMINE HCL 100 MG PO TABS
100.0000 mg | ORAL_TABLET | Freq: Every day | ORAL | Status: DC
  Filled 2019-11-09 (×2): qty 1

## 2019-11-09 MED ORDER — LOPERAMIDE HCL 2 MG PO CAPS: 2.0000 mg | ORAL_CAPSULE | ORAL | Status: DC | PRN

## 2019-11-09 MED ORDER — THIAMINE HCL 100 MG PO TABS
100.0000 mg | ORAL_TABLET | Freq: Every day | ORAL | Status: DC
  Administered 2019-11-10 – 2019-11-14 (×5): 100 mg via ORAL
  Filled 2019-11-09 (×6): qty 1

## 2019-11-09 MED ORDER — PNEUMOCOCCAL VAC POLYVALENT 25 MCG/0.5ML IJ INJ
0.5000 mL | INJECTION | INTRAMUSCULAR | Status: DC
  Filled 2019-11-09: qty 0.5

## 2019-11-09 MED ORDER — THIAMINE HCL 100 MG/ML IJ SOLN: 100.0000 mg | Freq: Once | INTRAMUSCULAR | Status: DC

## 2019-11-09 MED ORDER — GABAPENTIN 300 MG PO CAPS
600.0000 mg | ORAL_CAPSULE | Freq: Every day | ORAL | Status: DC
  Administered 2019-11-09 – 2019-11-13 (×5): 600 mg via ORAL
  Filled 2019-11-09 (×6): qty 2

## 2019-11-09 MED ORDER — VITAMIN B-12 1000 MCG PO TABS
1000.0000 ug | ORAL_TABLET | Freq: Every day | ORAL | Status: DC
  Administered 2019-11-10 – 2019-11-14 (×5): 1000 ug via ORAL
  Filled 2019-11-09 (×6): qty 1

## 2019-11-09 MED ORDER — LORATADINE 10 MG PO TABS
10.0000 mg | ORAL_TABLET | Freq: Every day | ORAL | Status: DC
  Administered 2019-11-10: 10 mg via ORAL
  Filled 2019-11-09 (×2): qty 1

## 2019-11-09 MED ORDER — ONDANSETRON 4 MG PO TBDP: 4.0000 mg | ORAL_TABLET | Freq: Four times a day (QID) | ORAL | Status: DC | PRN

## 2019-11-09 MED ORDER — VITAMIN B-6 100 MG PO TABS
100.0000 mg | ORAL_TABLET | Freq: Every day | ORAL | Status: DC
  Administered 2019-11-11 – 2019-11-14 (×4): 100 mg via ORAL
  Filled 2019-11-09 (×6): qty 1

## 2019-11-09 MED ORDER — TRAZODONE HCL 50 MG PO TABS
50.0000 mg | ORAL_TABLET | Freq: Every evening | ORAL | Status: DC | PRN
  Administered 2019-11-09 – 2019-11-13 (×4): 50 mg via ORAL
  Filled 2019-11-09 (×3): qty 1

## 2019-11-09 MED ORDER — MAGNESIUM HYDROXIDE 400 MG/5ML PO SUSP: 30.0000 mL | Freq: Every day | ORAL | Status: DC | PRN

## 2019-11-09 MED ORDER — FINASTERIDE 1 MG PO TABS: 1.0000 mg | ORAL_TABLET | Freq: Every day | ORAL | Status: DC

## 2019-11-09 NOTE — ED Provider Notes (Signed)
[facility]-EMERGENCY DEPT Provider Note   CSN: [id number] Arrival date & time: 11/09/19  1731     History Chief Complaint  Patient presents with  . Suicidal    Patrick Hamilton is a 34 y.o. male.  34 yo M with a chief complaints of suicidal ideation.  Patient states that all day today he has been thinking about killing himself.  Was planning on taking a knife from the door at his house and stabbing it into his abdomen.  He had thought about doing this earlier about a month or 2 ago.  Couldnt bring himself to do it.  He had lost his job recently and had an episode of hallucinations.  He was drinking heavily but quit for a while and then started drinking again yesterday.  His father was concerned about his wellbeing and brought him here for evaluation.  He denies medical complaint denies cough congestion fevers chills myalgias nausea vomiting or diarrhea.  No history of mental illness not on any medications.  The history is provided by the patient.  Illness Severity:  Moderate Onset quality:  Gradual Duration:  1 day Timing:  Constant Progression:  Worsening Chronicity:  Recurrent Associated symptoms: no abdominal pain, no chest pain, no congestion, no diarrhea, no fever, no headaches, no myalgias, no rash, no shortness of breath and no vomiting        Past Medical History:  Diagnosis Date  . Acne   . Allergic rhinitis   . Compartment syndrome (HCC) 2010   right leg  . Depression   . Hypothyroidism   . Sever's disease     Patient Active Problem List   Diagnosis Date Noted  . Cervical disc disease 03/18/2012  . Lumbar disc disorder 03/18/2012  . Hair changes 01/29/2012  . Fatigue 01/29/2012  . Warts left hand 01/15/2012  . Back pain, thoracic 08/19/2011  . Abnormal thyroid blood test 08/17/2011  . Subclinical hypothyroidism 08/17/2011  . ALCOHOL ABUSE, HX OF 05/24/2010  . DERMATITIS 07/12/2009  . ACNE VULGARIS 03/27/2008  . ALLERGIC RHINITIS  02/11/2007    Past Surgical History:  Procedure Laterality Date  . compartment syndrome release  2010   RLE  . ED visit   2011   [place] for Alcohol related problem  . GROWTH PLATE SURGERY  ~ 2000   right foot  . LUMBAR LAMINECTOMY/DECOMPRESSION MICRODISCECTOMY  05/02/2012   Procedure: LUMBAR LAMINECTOMY/DECOMPRESSION MICRODISCECTOMY;  Surgeon: Mat Carne, MD;  Location: [facility] OR;  Service: Orthopedics;  Laterality: Right;  lumbar five-sacral one laminotomy and disc excision right side  . nose fracture    . RHINOPLASTY  1999  . TONSILLECTOMY AND ADENOIDECTOMY  2003       Family History  Problem Relation Age of Onset  . Prostate cancer Father 50  . Autism Brother   . Diabetes Other   . Allergies Other   . Alcohol abuse Other        mom's side of the family  . Alcohol abuse Maternal Grandfather     Social History   Tobacco Use  . Smoking status: Former Smoker    Types: Cigarettes    Quit date: 09/26/2019    Years since quitting: 0.1  . Smokeless tobacco: Former Neurosurgeon    Types: Snuff  . Tobacco comment: 04/30/2012 "smoked & used snuff maybe for 1 wk total"  Substance Use Topics  . Alcohol use: Yes    Alcohol/week: 10.0 standard drinks    Types: 10 Shots of  liquor per week    Comment: Drinks a 5th daily  . Drug use: No    Home Medications Prior to Admission medications   Medication Sig Start Date End Date Taking? Authorizing Provider  fexofenadine (ALLEGRA) 180 MG tablet Take 180 mg by mouth daily.   Yes [provider]  finasteride (PROPECIA) 1 MG tablet Take 1 mg by mouth at bedtime.  01/05/16  Yes [provider]  gabapentin (NEURONTIN) 300 MG capsule Take 2 capsules (600 mg total) by mouth at bedtime. 10/27/19  Yes Panosh, Neta Mends, MD  ibuprofen (ADVIL,MOTRIN) 200 MG tablet Take 600 mg by mouth every 6 (six) hours as needed for headache, mild pain or moderate pain. For pain    Yes [provider]  melatonin 5 MG TABS Take 10 mg by  mouth at bedtime.    Yes [provider]  Multiple Vitamin (MULTIVITAMIN WITH MINERALS) TABS Take 1 tablet by mouth daily.   Yes [provider]  pyridOXINE (VITAMIN B-6) 100 MG tablet Take 100 mg by mouth daily. [place]   Yes [provider]  SYNTHROID 100 MCG tablet Take 1 tablet (100 mcg total) by mouth daily. 10/10/19  Yes Panosh, Neta Mends, MD  thiamine 100 MG tablet Take 100 mg by mouth daily. Vitamin B1 Nature Made   Yes [provider]  vitamin B-12 (CYANOCOBALAMIN) 1000 MCG tablet Take 1,000 mcg by mouth daily.    Yes [provider]    Allergies    Patient has no known allergies.  Review of Systems   Review of Systems  Constitutional: Negative for chills and fever.  HENT: Negative for congestion and facial swelling.   Eyes: Negative for discharge and visual disturbance.  Respiratory: Negative for shortness of breath.   Cardiovascular: Negative for chest pain and palpitations.  Gastrointestinal: Negative for abdominal pain, diarrhea and vomiting.  Musculoskeletal: Negative for arthralgias and myalgias.  Skin: Negative for color change and rash.  Neurological: Negative for tremors, syncope and headaches.  Psychiatric/Behavioral: Positive for suicidal ideas. Negative for confusion and dysphoric mood.    Physical Exam Updated Vital Signs BP (!) 147/67   Pulse 64   Temp 98.3 F (36.8 C) (Oral)   Resp 14   SpO2 97%   Physical Exam Vitals and nursing note reviewed.  Constitutional:      Appearance: He is well-developed.  HENT:     Head: Normocephalic and atraumatic.  Eyes:     Pupils: Pupils are equal, round, and reactive to light.  Neck:     Vascular: No JVD.  Cardiovascular:     Rate and Rhythm: Normal rate and regular rhythm.     Heart sounds: No murmur. No friction rub. No gallop.   Pulmonary:     Effort: No respiratory distress.     Breath sounds: No wheezing.  Abdominal:     General: There is no distension.      Tenderness: There is no abdominal tenderness. There is no guarding or rebound.  Musculoskeletal:        General: Normal range of motion.     Cervical back: Normal range of motion and neck supple.  Skin:    Coloration: Skin is not pale.     Findings: No rash.  Neurological:     Mental Status: He is alert and oriented to person, place, and time.  Psychiatric:        Behavior: Behavior normal.     ED Results / Procedures / Treatments  Labs (all labs ordered are listed, but only abnormal results are displayed) Labs Reviewed  SALICYLATE LEVEL - Abnormal; Notable for the following components:      Result Value   Salicylate Lvl <4.9 (*)    All other components within normal limits  ACETAMINOPHEN LEVEL - Abnormal; Notable for the following components:   Acetaminophen (Tylenol), Serum <10 (*)    All other components within normal limits  SARS CORONAVIRUS 2 BY RT PCR (HOSPITAL ORDER, [facility])  COMPREHENSIVE METABOLIC PANEL  ETHANOL  CBC  RAPID URINE DRUG SCREEN, HOSP PERFORMED    EKG None  Radiology No results found.  Procedures Procedures (including critical care time)  Medications Ordered in ED Medications - No data to display  ED Course  I have reviewed the triage vital signs and the nursing notes.  Pertinent labs & imaging results that were available during my care of the patient were reviewed by me and considered in my medical decision making (see chart for details).    MDM Rules/Calculators/A&P                      34 yo M with a chief complaint of suicidal ideation.  Patient had recently lost his job and has fallen into a depressive state.  Thought about harming himself about a month or so ago and then started experiencing the urge to do it again today.  He has a plan and has a knife which she had intended to use.  His father had found out and brought him to the ED for evaluation.  No history of mental illness.  Young and healthy no  significant past medical history.  I feel he is likely medically clear.  TTS evaluation.   TTS recommends inpatient.   The patients results and plan were reviewed and discussed.   Any x-rays performed were independently reviewed by myself.   Differential diagnosis were considered with the presenting HPI.  Medications - No data to display  Vitals:   11/09/19 1752 11/09/19 2112  BP: (!) 146/91 (!) 147/67  Pulse: 84 64  Resp: 15 14  Temp: 98.3 F (36.8 C)   TempSrc: Oral   SpO2: 100% 97%    Final diagnoses:  Suicidal ideation      Final Clinical Impression(s) / ED Diagnoses Final diagnoses:  Suicidal ideation    Rx / DC Orders ED Discharge Orders    None       Deno Etienne, DO 11/09/19 2231

## 2019-11-09 NOTE — ED Notes (Signed)
One labeled patient belongings bag in patient belongings room 5-8 cabinet.

## 2019-11-09 NOTE — ED Triage Notes (Signed)
Patient reports SI with plan to stab or hang self today. Reports hx of same in 2013. Hx depression. Reports lost his job in March. States he drinks daily. Last drink yesterday.

## 2019-11-09 NOTE — BH Assessment (Signed)
Tele Assessment Note   Patient Name: Patrick Hamilton MRN: [id number] Referring Physician: Deno Etienne, DO Location of Patient: [facility], [phone] Location of Provider: [place] is an 34 y.o. single male who presents unaccompanied to [facility] after Pt's father expressed concern for his wellbeing. Pt reports he has a history of depression and is currently severely depressed. He state he "has lost the will to live." He reports current suicidal ideation with plan to stab himself in the abdomen or hang himself. He states he is experiencing numerous stressors and relapsed on alcohol yesterday after 32 days sober. He states he has held a knife to his abdomen twice before but has never intentional harmed himself. Pt acknowledges symptoms including crying spells, social withdrawal, loss of interest in usual pleasures, fatigue, irritability, decreased concentration, decreased sleep, decreased appetite and feelings of guilt, worthlessness and hopelessness. Pt denies any history of intentional self-injurious behaviors. Pt says on 41/14/21 he had a panic attack and also heard "an evil laugh" and also saw himself being murdered in his residence. He denies any auditory or visual hallucinations since then. Pt denies current homicidal ideation or history of violence.  Pt reports between November 2020 and April 2021 he was was drinking approximately one fifth of liquor daily. He says he stopped drinking for 32 days and yesterday broke into his parent's liquor cabinet and drank a large quantity of alcohol. Pt denies other substance use.  Pt says in March 2021 he was fired from his job for having an inappropriate relationship with someone working under him. He says he moved from [place] to [place] to live with his parents. He states yesterday while intoxicated he sent the woman who fired him "a nasty e-mail" and an also sent naked pictures of his ex-girlfriend to over 25  of her coworkers. He identifies his parents and brother as his only support, stating he has no friends. Pt states he has poor social skills, insecurities and anger issues. He denies current legal problems. He denies access to firearms. Pt reports he was inpatient at [facility] in 2012 and followed up with outpatient treatment, however the inpatient admission is not indicated in Pt's medical record.  Pt is dressed in hospital scrubs, alert and oriented x4. Pt speaks in a clear tone, at moderate volume and normal pace. Motor behavior appears normal. Eye contact is good. Pt's mood is depressed and affect is congruent with mood. Thought process is coherent and relevant. There is no indication Pt is currently responding to internal stimuli or experiencing delusional thought content. Pt was calm and cooperative throughout assessment. He says he is willing to sign voluntarily into a psychiatric facility.   Diagnosis:  F33.2 Major depressive disorder, Recurrent episode, Severe F10.20 Alcohol use disorder, Severe   Past Medical History:  Past Medical History:  Diagnosis Date  . Acne   . Allergic rhinitis   . Compartment syndrome ([place]) 2010   right leg  . Depression   . Hypothyroidism   . Sever's disease     Past Surgical History:  Procedure Laterality Date  . compartment syndrome release  2010   RLE  . ED visit   2011   [place] for Alcohol related problem  . GROWTH PLATE SURGERY  ~ 4010   right foot  . LUMBAR LAMINECTOMY/DECOMPRESSION MICRODISCECTOMY  05/02/2012   Procedure: LUMBAR LAMINECTOMY/DECOMPRESSION MICRODISCECTOMY;  Surgeon: Lowella Grip, MD;  Location: [place];  Service: Orthopedics;  Laterality: Right;  lumbar five-sacral one laminotomy and disc excision right side  . nose fracture    . RHINOPLASTY  1999  . TONSILLECTOMY AND ADENOIDECTOMY  2003    Family History:  Family History  Problem Relation Age of Onset  . Prostate cancer Father 36  . Autism Brother   . Diabetes  Other   . Allergies Other   . Alcohol abuse Other        mom's side of the family  . Alcohol abuse Maternal Grandfather     Social History:  reports that he quit smoking about 6 weeks ago. His smoking use included cigarettes. He has quit using smokeless tobacco.  His smokeless tobacco use included snuff. He reports current alcohol use of about 10.0 standard drinks of alcohol per week. He reports that he does not use drugs.  Additional Social History:  Alcohol / Drug Use Pain Medications: Denies abuse Prescriptions: Denies abuse Over the Counter: Denies abuse History of alcohol / drug use?: Yes Longest period of sobriety (when/how long): 32 days Substance #1 Name of Substance 1: Alcohol 1 - Age of First Use: Adolescent 1 - Amount (size/oz): up to 1 fifth of liquor 1 - Frequency: Daily 1 - Duration: Drank daily November 2020-April 2020, was sober 32 days until yesterday 1 - Last Use / Amount: 11/08/2019, unknown amount  CIWA: CIWA-Ar BP: (!) 146/91 Pulse Rate: 84 COWS:    Allergies: No Known Allergies  Home Medications: (Not in a hospital admission)   OB/GYN Status:  No LMP for male patient.  General Assessment Data Location of Assessment: [facility] ED TTS Assessment: In system Is this a Tele or Face-to-Face Assessment?: Tele Assessment Is this an Initial Assessment or a Re-assessment for this encounter?: Initial Assessment Patient Accompanied by:: N/A Language Other than English: No Living Arrangements: Other (Comment)(Staying with parents) What gender do you identify as?: Male Marital status: Single Maiden name: NA Pregnancy Status: No Living Arrangements: Parent Can pt return to current living arrangement?: Yes Admission Status: Voluntary Is patient capable of signing voluntary admission?: Yes Referral Source: Self/Family/Friend Insurance type: BCBS     Crisis Care Plan Living Arrangements: Parent Legal Guardian: Other:(Self) Name of Psychiatrist: None Name of  Therapist: Starting with Bradley Ferris 11/18/19  Education Status Is patient currently in school?: No Is the patient employed, unemployed or receiving disability?: Unemployed  Risk to self with the past 6 months Suicidal Ideation: Yes-Currently Present Has patient been a risk to self within the past 6 months prior to admission? : Yes Suicidal Intent: Yes-Currently Present Has patient had any suicidal intent within the past 6 months prior to admission? : Yes Is patient at risk for suicide?: Yes Suicidal Plan?: Yes-Currently Present Has patient had any suicidal plan within the past 6 months prior to admission? : Yes Specify Current Suicidal Plan: Stab himself in abdomen or hang himself Access to Means: Yes Specify Access to Suicidal Means: Access to household items What has been your use of drugs/alcohol within the last 12 months?: Pt reports drinking alcohol Previous Attempts/Gestures: Yes How many times?: 2(Threatened to stab himself) Other Self Harm Risks: None Triggers for Past Attempts: Other personal contacts Intentional Self Injurious Behavior: None Family Suicide History: No Recent stressful life event(s): Job Loss, Financial Problems, Loss (Comment) Persecutory voices/beliefs?: No Depression: Yes Depression Symptoms: Despondent, Tearfulness, Isolating, Fatigue, Guilt, Loss of interest in usual pleasures, Feeling worthless/self pity, Feeling angry/irritable, Insomnia Substance abuse history and/or treatment for substance abuse?: No Suicide prevention information given  to non-admitted patients: Not applicable  Risk to Others within the past 6 months Homicidal Ideation: No Does patient have any lifetime risk of violence toward others beyond the six months prior to admission? : No Thoughts of Harm to Others: No Current Homicidal Intent: No Current Homicidal Plan: No Access to Homicidal Means: No Identified Victim: None History of harm to others?: No Assessment of Violence: None  Noted Violent Behavior Description: Pt denies history of violence Does patient have access to weapons?: No Criminal Charges Pending?: No Does patient have a court date: No Is patient on probation?: No  Psychosis Hallucinations: None noted Delusions: None noted  Mental Status Report Appearance/Hygiene: In scrubs Eye Contact: Good Motor Activity: Unremarkable Speech: Logical/coherent Level of Consciousness: Alert Mood: Depressed Affect: Depressed Anxiety Level: Minimal Thought Processes: Coherent, Relevant Judgement: Partial Orientation: Person, Place, Time, Situation Obsessive Compulsive Thoughts/Behaviors: None  Cognitive Functioning Concentration: Normal Memory: Recent Intact, Remote Intact Is patient IDD: No Insight: Fair Impulse Control: Fair Appetite: Poor Have you had any weight changes? : No Change Sleep: Decreased Total Hours of Sleep: 3 Vegetative Symptoms: None  ADLScreening [facility] Assessment Services) Patient's cognitive ability adequate to safely complete daily activities?: Yes Patient able to express need for assistance with ADLs?: Yes Independently performs ADLs?: Yes (appropriate for developmental age)  Prior Inpatient Therapy Prior Inpatient Therapy: Yes Prior Therapy Dates: 2012 Prior Therapy Facilty/Provider(s): [facility] Reason for Treatment: Depression  Prior Outpatient Therapy Prior Outpatient Therapy: Yes Prior Therapy Dates: 2012 Prior Therapy Facilty/Provider(s): [facility] Reason for Treatment: MDD Does patient have an ACCT team?: No Does patient have Intensive In-House Services?  : No Does patient have [facility] services? : No Does patient have P4CC services?: No  ADL Screening (condition at time of admission) Patient's cognitive ability adequate to safely complete daily activities?: Yes Is the patient deaf or have difficulty hearing?: No Does the patient have difficulty seeing, even when wearing glasses/contacts?: No Does  the patient have difficulty concentrating, remembering, or making decisions?: No Patient able to express need for assistance with ADLs?: Yes Does the patient have difficulty dressing or bathing?: No Independently performs ADLs?: Yes (appropriate for developmental age) Does the patient have difficulty walking or climbing stairs?: No Weakness of Legs: None Weakness of Arms/Hands: None  Home Assistive Devices/Equipment Home Assistive Devices/Equipment: None    Abuse/Neglect Assessment (Assessment to be complete while patient is alone) Abuse/Neglect Assessment Can Be Completed: Yes Physical Abuse: Denies Verbal Abuse: Denies Sexual Abuse: Denies Exploitation of patient/patient's resources: Denies Self-Neglect: Denies     Merchant navy officer (For Healthcare) Does Patient Have a Medical Advance Directive?: No Would patient like information on creating a medical advance directive?: No - Patient declined          Disposition: Gave clinical report to Nira Conn, FNP who said Pt meets criteria for inpatient psychiatric treatment. Binnie Rail, [facility] at [facility], confirmed room 301-1 is available. Pt is accepted to the service of Dr. Sallyanne Havers pending COVID test. Notified Dr. Melene Plan and Verdie Shire, RN of acceptance.  Disposition Initial Assessment Completed for this Encounter: Yes  This service was provided via telemedicine using a 2-way, interactive audio and video technology.  Names of all persons participating in this telemedicine service and their role in this encounter. Name: Mamie Nick Role: Patient  Name: Shela Commons, [facility] Role: TTS counselor         Harlin Rain Patsy Baltimore, [facility], [facility] Triage Specialist [phone]  Patsy Baltimore, [place] Dach  Rennis Harding 11/09/2019 8:01 PM

## 2019-11-09 NOTE — ED Notes (Signed)
[facility] called to transport patient.

## 2019-11-09 NOTE — Tx Team (Signed)
Initial Treatment Plan 11/09/2019 11:59 PM Patrick Hamilton Patrick Hamilton [id number]    PATIENT STRESSORS: Financial difficulties Legal issue Marital or family conflict Occupational concerns Substance abuse Traumatic event   PATIENT STRENGTHS: Average or above average intelligence Communication skills Supportive family/friends   PATIENT IDENTIFIED PROBLEMS: Depression  Suicidal Ideation  Substance abuse (alcohol)        "No job"  "Life"       DISCHARGE CRITERIA:  Improved stabilization in mood, thinking, and/or behavior Motivation to continue treatment in a less acute level of care Need for constant or close observation no longer present Verbal commitment to aftercare and medication compliance Withdrawal symptoms are absent or subacute and managed without 24-hour nursing intervention  PRELIMINARY DISCHARGE PLAN: Attend 12-step recovery group Outpatient therapy Return to previous living arrangement  PATIENT/FAMILY INVOLVEMENT: This treatment plan has been presented to and reviewed with the patient, Patrick Hamilton.  The patient and family have been given the opportunity to ask questions and make suggestions.  Juliann Pares, RN 11/09/2019, 11:59 PM

## 2019-11-09 NOTE — ED Notes (Signed)
TTS assessment ongoing at this time.   

## 2019-11-10 DIAGNOSIS — F332 Major depressive disorder, recurrent severe without psychotic features: Principal | ICD-10-CM

## 2019-11-10 DIAGNOSIS — F1024 Alcohol dependence with alcohol-induced mood disorder: Secondary | ICD-10-CM

## 2019-11-10 LAB — TSH: TSH: 1.621 u[IU]/mL (ref 0.350–4.500)

## 2019-11-10 LAB — LIPID PANEL
Cholesterol: 147 mg/dL (ref 0–200)
HDL: 32 mg/dL — ABNORMAL LOW (ref >40)
LDL Cholesterol: 95 mg/dL (ref 0–99)
Total CHOL/HDL Ratio: 4.6 RATIO
Triglycerides: 99 mg/dL (ref <150)
VLDL: 20 mg/dL (ref 0–40)

## 2019-11-10 MED ORDER — ACAMPROSATE CALCIUM 333 MG PO TBEC
666.0000 mg | DELAYED_RELEASE_TABLET | Freq: Three times a day (TID) | ORAL | Status: DC
  Administered 2019-11-10 – 2019-11-14 (×12): 666 mg via ORAL
  Filled 2019-11-10 (×18): qty 2

## 2019-11-10 MED ORDER — SERTRALINE HCL 50 MG PO TABS
50.0000 mg | ORAL_TABLET | Freq: Every day | ORAL | Status: DC
  Administered 2019-11-10 – 2019-11-14 (×5): 50 mg via ORAL
  Filled 2019-11-10 (×8): qty 1

## 2019-11-10 NOTE — BHH Counselor (Signed)
Adult Comprehensive Assessment  Patient ID: Patrick Hamilton, male   DOB: 04/07/1986, 34 y.o.   MRN: [id number]  Information Source:    Current Stressors:  Patient states their primary concerns and needs for treatment are:: addressing his self-destructiveness, depression, and drinking Patient states their goals for this hospitilization and ongoing recovery are:: addressing his self-destructiveness, depression, and drinking. Getting connected with aftercare tx (probably outpatient) Educational / Learning stressors: no Employment / Job issues: Recently lost job in March Family Relationships: Mom and Father are supportive. Also has a younger brother who is autistic Museum/gallery curator / Lack of resources (include bankruptcy): Has severence from his job though that will come to an end soon. Support from parents Housing / Lack of housing: Cancelled his lease in [place]. Is now living with parents Physical health (include injuries & life threatening diseases): none Social relationships: recently broke up with his girlfriend who was his Librarian, academic. Result in job loss. Pt reports no other social relationships Substance abuse: 1/5 liquor per day. Sober for 1 month prior to day of admission to [facility] when he then drank 1/5th of liquor Bereavement / Loss: job and relationship  Living/Environment/Situation:  Living Arrangements: Parent, Other relatives Living conditions (as described by patient or guardian): Good Who else lives in the home?: Mom, dad, younger brother How long has patient lived in current situation?: 1 month What is atmosphere in current home: Supportive  Family History:  Marital status: Single Are you sexually active?: Yes(not since end of relationship in March, 2021) What is your sexual orientation?: Heterosexual Has your sexual activity been affected by drugs, alcohol, medication, or emotional stress?: Yes Does patient have children?: No  Childhood History:  By whom was/is the patient  raised?: Both parents, Sibling Additional childhood history information: Describes childhood has supportive Description of patient's relationship with caregiver when they were a child: Good Patient's description of current relationship with people who raised him/her: Good How were you disciplined when you got in trouble as a child/adolescent?: spanked, grounded Does patient have siblings?: Yes Number of Siblings: 1 Description of patient's current relationship with siblings: Good, brother who is 2 years younger and autistic Did patient suffer any verbal/emotional/physical/sexual abuse as a child?: No Did patient suffer from severe childhood neglect?: No Has patient ever been sexually abused/assaulted/raped as an adolescent or adult?: No Was the patient ever a victim of a crime or a disaster?: No Witnessed domestic violence?: No Has patient been effected by domestic violence as an adult?: No  Education:  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 [place] Did You Receive Any Psychiatric Treatment/Services While in the [employer]?: No Are There Guns or Other Weapons in [place]?: Yes Types of Guns/Weapons: two shot guns Are These Psychologist, educational?: (doesn't know)  Financial Resources:   Financial resources: No income Does patient have a Programmer, applications or guardian?: No  Alcohol/Substance Abuse:   What has been your use of drugs/alcohol within the last 12 months?: 1/5 liquor/day with 1  month from april  to may If yes, describe treatment: no  tx hx Has alcohol/substance abuse ever caused legal problems?: No(May have legal issues after sending text messages to former coworkers when drunk)  [place]:   Patient's Community Support System: Good Describe Community Support System: Pt describes support system as good. Reports parents are support system, no other friends Type of faith/religion: none  Leisure/Recreation:   Leisure and Hobbies: Outdoors, working out  Strengths/Needs:   What is the patient's perception of their strengths?: pt believes he has no strengths Patient states they can use these personal strengths during their treatment to contribute to their recovery: pt believes he has no strengths Patient states these barriers may affect/interfere with their treatment: Pt explains he is self-desctructive which would interfere with his treatment Patient states these barriers may affect their return to the community: self descructiveness  Discharge Plan:   Currently receiving community mental health services: Yes (From Whom)(Has first appointment at [facility]  on May 25) Patient states concerns and preferences for aftercare planning are: Sounds interested in IOP Does patient have access to transportation?: Yes Does patient have financial barriers related to discharge medications?: No Will patient be returning to same living situation after discharge?: Yes  Summary/Recommendations:   Summary and Recommendations (to be completed by the evaluator): Pt is from [place] and completed college at [employer]. 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 Pt reports 1/5 per day alcohol use for the past year. States he was in a relationship with his supervisor which was Engineer, site. He reports when he broke up with her that she reported him to HR and he was then fired at  that point in March 2021. Pt explains he was drinking before, during, and after work. Pt reports his alcohol use continued. In April he stopped drinking for two days and reports hearing an evil laugh and witnessing himself being murdered after 2 days of no drinking. Pt explains at that point he drove to [place] where his father met him and he returned to live with his parents and younger brother the past month. Pt remained sober for about 1 month until he drank a 5th of liquor prior to admission. Pt states at that point he text explicit images to former coworkers and explains he may have legal consequences due to this. Pt wants to quit drinking and address is self-destructive habits. He plans to return home with his parents and is interested in [place]. Pt does have an appointment scheduled at [facility] for OP therapy on 11/18/19. He reports he recieved outpatient tx for depression in 2012 with [facility] but other than that has no other tx history.  [place]. 11/10/2019

## 2019-11-10 NOTE — BHH Suicide Risk Assessment (Signed)
[facility] Admission Suicide Risk Assessment   Nursing information obtained from:  Patient Demographic factors:  Male, Caucasian, Unemployed Current Mental Status:  Suicidal ideation indicated by patient, Suicidal ideation indicated by others, Intention to act on plan to harm others, Self-harm thoughts Loss Factors:  Decrease in vocational status, Loss of significant relationship, Legal issues, Financial problems / change in socioeconomic status Historical Factors:  Impulsivity Risk Reduction Factors:  Living with another person, especially a relative, Sense of responsibility to family  Total Time spent with patient: 45 minutes Principal Problem: Alcohol use disorder, alcohol induced mood disorder versus MDD Diagnosis:  Active Problems:   Severe recurrent major depression without psychotic features (HCC)  Subjective Data:   Continued Clinical Symptoms:  Alcohol Use Disorder Identification Test Final Score (AUDIT): 40 The "Alcohol Use Disorders Identification Test", Guidelines for Use in Primary Care, Second Edition.  World Science writer [facility]). Score between 0-7:  no or low risk or alcohol related problems. Score between 8-15:  moderate risk of alcohol related problems. Score between 16-19:  high risk of alcohol related problems. Score 20 or above:  warrants further diagnostic evaluation for alcohol dependence and treatment.   CLINICAL FACTORS:  34 year old male.  Currently living with parents locally.  He reports a history of alcohol use disorder.  He has been drinking daily/heavily up to mid April.  At that time was employed and living in [facility]  States he lost his job due to having a relationship with a Radio broadcast assistant.  He stopped drinking in mid April at which time he developed short-lived hallucinatory experiences which appear to have been related to withdrawal (no prior history of psychosis and none since then).  He relapsed x1 day prior to admission.  States that while intoxicated  he sent out inappropriate pictures of a previous coworker.  He developed acute suicidal ideations with thoughts of stabbing self or overdosing and presented to the hospital.  As noted he reports he drank x1 day only and is not currently presenting with symptoms of withdrawal.  He does report a prior history of depressive episodes and has been in treatment with an antidepressant in 2012 for depression.   Psychiatric Specialty Exam: Physical Exam  Review of Systems  Blood pressure 119/70, pulse 79, temperature (!) 97.4 F (36.3 C), temperature source Oral, resp. rate 16, height 6' 1.25" (1.861 m), weight 91.6 kg, SpO2 100 %.Body mass index is 26.47 kg/m.  See admit note MSE   COGNITIVE FEATURES THAT CONTRIBUTE TO RISK:  Closed-mindedness and Loss of executive function    SUICIDE RISK:   Moderate:  Frequent suicidal ideation with limited intensity, and duration, some specificity in terms of plans, no associated intent, good self-control, limited dysphoria/symptomatology, some risk factors present, and identifiable protective factors, including available and accessible social support.  PLAN OF CARE: Patient will be admitted to inpatient psychiatric unit for stabilization and safety. Will provide and encourage milieu participation. Provide medication management and maked adjustments as needed.  Will follow daily.    I certify that inpatient services furnished can reasonably be expected to improve the patient's condition.   Craige Cotta, MD 11/10/2019, 10:54 AM

## 2019-11-10 NOTE — BHH Group Notes (Signed)
LCSW Group Therapy Note 11/10/2019 2:19 PM  Type of Therapy and Topic: Group Therapy: Overcoming Obstacles  Participation Level: Did Not Attend  Description of Group:  In this group patients will be encouraged to explore what they see as obstacles to their own wellness and recovery. They will be guided to discuss their thoughts, feelings, and behaviors related to these obstacles. The group will process together ways to cope with barriers, with attention given to specific choices patients can make. Each patient will be challenged to identify changes they are motivated to make in order to overcome their obstacles. This group will be process-oriented, with patients participating in exploration of their own experiences as well as giving and receiving support and challenge from other group members.  Therapeutic Goals: 1. Patient will identify personal and current obstacles as they relate to admission. 2. Patient will identify barriers that currently interfere with their wellness or overcoming obstacles.  3. Patient will identify feelings, thought process and behaviors related to these barriers. 4. Patient will identify two changes they are willing to make to overcome these obstacles:   Summary of Patient Progress  Invited, chose not to attend.    Therapeutic Modalities:  Cognitive Behavioral Therapy Solution Focused Therapy Motivational Interviewing Relapse Prevention Therapy   Alcario Drought Clinical Social Worker

## 2019-11-10 NOTE — Progress Notes (Signed)
   11/10/19 0803  Vital Signs  Temp (!) 97.4 F (36.3 C)  Temp Source Oral  Pulse Rate 79  BP 119/70  BP Location Right Arm  BP Method Automatic  Patient Position (if appropriate) Sitting  D: Patient presents with a depressed and anxious affect. Patient rated depression 7/10 and anxiety 6/10.Marland Kitchen Patient took all available medicine. Patient stated that he had some pain from a right tibia fraction, but refused any intervention for the pain. A:  Patient took scheduled medicine.  Support and encouragement provided Routine safety checks conducted every 15 minutes. Patient  Informed to notify staff with any concerns.  Safety maintained. R:  No adverse drug reactions noted.  Patient contracts for safety.  Patient compliant with medication and treatment plan. Patient cooperative and calm. Patient isolative in room.

## 2019-11-10 NOTE — Tx Team (Signed)
Interdisciplinary Treatment and Diagnostic Plan Update  11/10/2019 Time of Session: 9:00am Patrick WAGGLE MRN: [id number]  Principal Diagnosis: <principal problem not specified>  Secondary Diagnoses: Active Problems:   Severe recurrent major depression without psychotic features (Patrick Hamilton)   Current Medications:  Current Facility-Administered Medications  Medication Dose Route Frequency Provider Last Rate Last Admin  . acetaminophen (TYLENOL) tablet 650 mg  650 mg Oral Q6H PRN Lindon Romp A, NP      . alum & mag hydroxide-simeth (MAALOX/MYLANTA) 200-200-20 MG/5ML suspension 30 mL  30 mL Oral Q4H PRN Lindon Romp A, NP      . finasteride (PROPECIA) tablet 1 mg  1 mg Oral QHS Lindon Romp A, NP      . gabapentin (NEURONTIN) capsule 600 mg  600 mg Oral QHS Lindon Romp A, NP   600 mg at 11/09/19 2358  . hydrOXYzine (ATARAX/VISTARIL) tablet 25 mg  25 mg Oral TID PRN Rozetta Nunnery, NP      . levothyroxine (SYNTHROID) tablet 100 mcg  100 mcg Oral Daily Lindon Romp A, NP   100 mcg at 11/10/19 0606  . loperamide (IMODIUM) capsule 2-4 mg  2-4 mg Oral PRN Lindon Romp A, NP      . loratadine (CLARITIN) tablet 10 mg  10 mg Oral Daily Lindon Romp A, NP   10 mg at 11/10/19 0867  . LORazepam (ATIVAN) tablet 1 mg  1 mg Oral Q6H PRN Lindon Romp A, NP      . magnesium hydroxide (MILK OF MAGNESIA) suspension 30 mL  30 mL Oral Daily PRN Lindon Romp A, NP      . multivitamin with minerals tablet 1 tablet  1 tablet Oral Daily Lindon Romp A, NP   1 tablet at 11/10/19 [id number]  . ondansetron (ZOFRAN-ODT) disintegrating tablet 4 mg  4 mg Oral Q6H PRN Lindon Romp A, NP      . pneumococcal 23 valent vaccine (PNEUMOVAX-23) injection 0.5 mL  0.5 mL Intramuscular Tomorrow-1000 Cobos, Fernando A, MD      . pyridOXINE (VITAMIN B-6) tablet 100 mg  100 mg Oral Daily Lindon Romp A, NP      . thiamine (B-1) injection 100 mg  100 mg Intramuscular Once Lindon Romp A, NP      . thiamine tablet 100 mg  100 mg Oral Daily Lindon Romp A, NP   100 mg at 11/10/19 0932  . traZODone (DESYREL) tablet 50 mg  50 mg Oral QHS PRN Lindon Romp A, NP   50 mg at 11/09/19 2310  . vitamin B-12 (CYANOCOBALAMIN) tablet 1,000 mcg  1,000 mcg Oral Daily Lindon Romp A, NP   1,000 mcg at 11/10/19 6712   PTA Medications: Medications Prior to Admission  Medication Sig Dispense Refill Last Dose  . fexofenadine (ALLEGRA) 180 MG tablet Take 180 mg by mouth daily.     . finasteride (PROPECIA) 1 MG tablet Take 1 mg by mouth at bedtime.      . gabapentin (NEURONTIN) 300 MG capsule Take 2 capsules (600 mg total) by mouth at bedtime. 60 capsule 3   . ibuprofen (ADVIL,MOTRIN) 200 MG tablet Take 600 mg by mouth every 6 (six) hours as needed for headache, mild pain or moderate pain. For pain      . melatonin 5 MG TABS Take 10 mg by mouth at bedtime.      . Multiple Vitamin (MULTIVITAMIN WITH MINERALS) TABS Take 1 tablet by mouth daily.     Marland Kitchen pyridOXINE (VITAMIN B-6)  100 MG tablet Take 100 mg by mouth daily. [facility]     . SYNTHROID 100 MCG tablet Take 1 tablet (100 mcg total) by mouth daily. 90 tablet 1   . thiamine 100 MG tablet Take 100 mg by mouth daily. Vitamin B1 Nature Made     . vitamin B-12 (CYANOCOBALAMIN) 1000 MCG tablet Take 1,000 mcg by mouth daily.        Patient Stressors: Arts development officer issue Marital or family conflict Occupational concerns Substance abuse Traumatic event  Patient Strengths: Average or above average intelligence Communication skills Supportive family/friends  Treatment Modalities: Medication Management, Group therapy, Case management,  1 to 1 session with clinician, Psychoeducation, Recreational therapy.   Physician Treatment Plan for Primary Diagnosis: <principal problem not specified> Long Term Goal(s):     Short Term Goals:    Medication Management: Evaluate patient's response, side effects, and tolerance of medication regimen.  Therapeutic Interventions: 1 to 1 sessions, Unit  Group sessions and Medication administration.  Evaluation of Outcomes: Not Met  Physician Treatment Plan for Secondary Diagnosis: Active Problems:   Severe recurrent major depression without psychotic features (Patrick Hamilton)  Long Term Goal(s):     Short Term Goals:       Medication Management: Evaluate patient's response, side effects, and tolerance of medication regimen.  Therapeutic Interventions: 1 to 1 sessions, Unit Group sessions and Medication administration.  Evaluation of Outcomes: Not Met   RN Treatment Plan for Primary Diagnosis: <principal problem not specified> Long Term Goal(s): Knowledge of disease and therapeutic regimen to maintain health will improve  Short Term Goals: Ability to participate in decision making will improve, Ability to verbalize feelings will improve, Ability to disclose and discuss suicidal ideas, Ability to identify and develop effective coping behaviors will improve and Compliance with prescribed medications will improve  Medication Management: RN will administer medications as ordered by provider, will assess and evaluate patient's response and provide education to patient for prescribed medication. RN will report any adverse and/or side effects to prescribing provider.  Therapeutic Interventions: 1 on 1 counseling sessions, Psychoeducation, Medication administration, Evaluate responses to treatment, Monitor vital signs and CBGs as ordered, Perform/monitor CIWA, COWS, AIMS and Fall Risk screenings as ordered, Perform wound care treatments as ordered.  Evaluation of Outcomes: Not Met   LCSW Treatment Plan for Primary Diagnosis: <principal problem not specified> Long Term Goal(s): Safe transition to appropriate next level of care at discharge, Engage patient in therapeutic group addressing interpersonal concerns.  Short Term Goals: Engage patient in aftercare planning with referrals and resources  Therapeutic Interventions: Assess for all discharge  needs, 1 to 1 time with Social worker, Explore available resources and support systems, Assess for adequacy in community support network, Educate family and significant other(s) on suicide prevention, Complete Psychosocial Assessment, Interpersonal group therapy.  Evaluation of Outcomes: Not Met   Progress in Treatment: Attending groups: No. New to unit  Participating in groups: No. Taking medication as prescribed: Yes. Toleration medication: Yes. Family/Significant other contact made: Yes, individual(s) contacted:  the patient's parents Patient understands diagnosis: Yes. Discussing patient identified problems/goals with staff: Yes. Medical problems stabilized or resolved: Yes. Denies suicidal/homicidal ideation: Yes. Issues/concerns per patient self-inventory: No. Other:   New problem(s) identified: None   New Short Term/Long Term Goal(s): Detox, medication stabilization, elimination of SI thoughts, development of comprehensive mental wellness plan.    Patient Goals:  "I'm an alcoholic and I want to beat that. I want to destruct my self harming behaviors"  Discharge  Plan or Barriers: Patient recently admitted. CSW will continue to follow and assess for appropriate referrals and possible discharge planning.    Reason for Continuation of Hospitalization: Anxiety Depression Medication stabilization Suicidal ideation  Estimated Length of Stay: 3-5 days   Attendees: Patient: Patrick Hamilton  11/10/2019 10:42 AM  Physician:  11/10/2019 10:42 AM  Nursing:  11/10/2019 10:42 AM  RN Care Manager: 11/10/2019 10:42 AM  Social Worker: Radonna Ricker, LCSW 11/10/2019 10:42 AM  Recreational Therapist:  11/10/2019 10:42 AM  Other: Harriett Sine, NP 11/10/2019 10:42 AM  Other:  11/10/2019 10:42 AM  Other: 11/10/2019 10:42 AM    Scribe for Treatment Team: Marylee Floras, [place] 11/10/2019 10:42 AM

## 2019-11-10 NOTE — Progress Notes (Signed)
Recreation Therapy Notes  Date:  5.17.21 Time: 0930 Location: [address] Dayroom  Group Topic: Stress Management  Goal Area(s) Addresses:  Patient will identify positive stress management techniques. Patient will identify benefits of using stress management post d/c.  Behavioral Response:  Engaged  Intervention: Stress Management  Activity:  Beach Visualization.  LRT read a script that took patients on a journey through a tropical forrest to the beach.  Patients were to listen and follow along as script was read to engage in activity.    Education: Stress Management, Discharge Planning.   Education Outcome: Acknowledges Education  Clinical Observations/Feedback: Pt attended and participated in group activity.     Kyndal Heringer, LRT/CTRS        Laira Penninger A 11/10/2019 11:14 AM 

## 2019-11-10 NOTE — H&P (Signed)
Psychiatric Admission Assessment Adult  Patient Identification: Patrick Hamilton MRN:  [id number] Date of Evaluation:  11/10/2019 Chief Complaint:  " yesterday I wanted to kill myself " Principal Diagnosis:  MDD versus Alcohol Induced Mood Disorder  Diagnosis:  MDD versus Alcohol Induced Mood Disorder  History of Present Illness:34 year old male, presented to hospital voluntarily due to suicidal ideations , with thoughts of overdosing or stabbing self, which he reports started yesterday following relapse on alcohol. States he told his parents and was brought to ED . He reports he has been feeling depressed over the last several weeks following losing his job of 7 year in April 2021. States that at the time was also drinking daily, heavily. He reports that he stopped drinking mid April ( at which time he developed brief auditory /visual hallucinations felt to be [facility], which lasted about a day). He remained sober until day prior to admission when he relapsed on alcohol , drank heavily. He states that in the context of intoxication he sent some inappropriate pictures of a prior coworker to others and is now concerned about possible legal repercussions . Endorses neuro-vegetative symptoms as below 5/16 BAL negative, UDS negative   Associated Signs/Symptoms: Depression Symptoms:  depressed mood, anhedonia, insomnia, suicidal thoughts with specific plan, loss of energy/fatigue, (Hypo) Manic Symptoms: none noted or endorsed  Anxiety Symptoms:  Some increased anxiety in the context of recent stressors Psychotic Symptoms:  None at this time PTSD Symptoms: Does not endorse  Total Time spent with patient: 45 minutes  Past Psychiatric History: denies history of prior psychiatric admissions . He reports he participated in an IOP in 2021 for depression and suicidal ideations at the time. No history of suicide attempts, no history of self cutting . No clear history of mania or hypomania. Endorses past  history of depressive episodes , which he states have occurred even while abstinent . He reports recent hallucinatory experiences while withdrawing from alcohol in April but no other history of psychosis. Reports occasional panic attacks, no agoraphobia. He reports he feels he worries excessively.  Is the patient at risk to self? Yes.    Has the patient been a risk to self in the past 6 months? Yes.    Has the patient been a risk to self within the distant past? No.  Is the patient a risk to others? No.  Has the patient been a risk to others in the past 6 months? No.  Has the patient been a risk to others within the distant past? No.   Prior Inpatient Therapy:  denies  Prior Outpatient Therapy:  none current  Alcohol Screening: 1. How often do you have a drink containing alcohol?: 4 or more times a week(Pt relapsed after 32 days sober) 2. How many drinks containing alcohol do you have on a typical day when you are drinking?: 10 or more 3. How often do you have six or more drinks on one occasion?: Daily or almost daily AUDIT-C Score: 12 4. How often during the last year have you found that you were not able to stop drinking once you had started?: Daily or almost daily 5. How often during the last year have you failed to do what was normally expected from you because of drinking?: Daily or almost daily 6. How often during the last year have you needed a first drink in the morning to get yourself going after a heavy drinking session?: Daily or almost daily 7. How often during  the last year have you had a feeling of guilt of remorse after drinking?: Daily or almost daily 8. How often during the last year have you been unable to remember what happened the night before because you had been drinking?: Daily or almost daily 9. Have you or someone else been injured as a result of your drinking?: Yes, during the last year 10. Has a relative or friend or a doctor or another health worker been concerned  about your drinking or suggested you cut down?: Yes, during the last year Alcohol Use Disorder Identification Test Final Score (AUDIT): 40 Alcohol Brief Interventions/Follow-up: Alcohol Education, Brief Advice, Continued Monitoring, Medication Offered/Prescribed(Pt had been sober 32 days until recent one night relapse) Substance Abuse History in the last 12 months:  Reports history of alcohol use disorder. Reports he had been drinking daily and heavily. States he had stopped drinking around mid April and describes possible WDL symptoms with hallucinatory experiences. He states he has been abstinent from alcohol since April until day before admission. Denies drug abuse .  Consequences of Substance Abuse: Denies history of WDL seizures, no history of DUIs, (+) alcohol related blackouts, reports episode in April where he experienced visual and auditory hallucinations in the context of alcohol WDL .  Previous Psychotropic Medications: reports he had been on an antidepressant for a period of time in 2012 but has not taken in years- does not remember name . He was started on Neurontin by his PCP ( patient reports for anxiety and insomnia) a few weeks ago. Psychological Evaluations: No  Past Medical History: history of hypothyroidism for which he takes Synthroid 100 micrograms  Past Medical History:  Diagnosis Date  . Acne   . Allergic rhinitis   . Compartment syndrome (HCC) 2010   right leg  . Depression   . Hypothyroidism   . Sever's disease     Past Surgical History:  Procedure Laterality Date  . compartment syndrome release  2010   RLE  . ED visit   2011   [place] for Alcohol related problem  . GROWTH PLATE SURGERY  ~ 2000   right foot  . LUMBAR LAMINECTOMY/DECOMPRESSION MICRODISCECTOMY  05/02/2012   Procedure: LUMBAR LAMINECTOMY/DECOMPRESSION MICRODISCECTOMY;  Surgeon: Mat Carne, MD;  Location: [facility] OR;  Service: Orthopedics;  Laterality: Right;  lumbar five-sacral one laminotomy  and disc excision right side  . nose fracture    . RHINOPLASTY  1999  . TONSILLECTOMY AND ADENOIDECTOMY  2003   Family History: lives with biological parents, has one brother. Family History  Problem Relation Age of Onset  . Prostate cancer Father 20  . Autism Brother   . Diabetes Other   . Allergies Other   . Alcohol abuse Other        mom's side of the family  . Alcohol abuse Maternal Grandfather    Family Psychiatric  History: brother has autism spectrum disorder,no other mental illness in family, no history of suicides, maternal grandfather had history of alcohol use disorder Tobacco Screening: recently stopped smoking earlier this year  Social History: 33, single, no children, lives with parents, currently not employed, had been living in [place] until he lost his job in April, moved back to [place] to reside with parents   Social History   Substance and Sexual Activity  Alcohol Use Yes  . Alcohol/week: 10.0 standard drinks  . Types: 10 Shots of liquor per week   Comment: Drinks a 5th daily     Social History  Substance and Sexual Activity  Drug Use No    Additional Social History: Marital status: Single Are you sexually active?: Yes(not since end of relationship in March, 2021) What is your sexual orientation?: Heterosexual Has your sexual activity been affected by drugs, alcohol, medication, or emotional stress?: Yes Does patient have children?: No  Allergies:  NKDA Lab Results:  Results for orders placed or performed during the hospital encounter of 11/09/19 (from the past 48 hour(s))  Lipid panel     Status: Abnormal   Collection Time: 11/10/19  6:12 AM  Result Value Ref Range   Cholesterol 147 0 - 200 mg/dL   Triglycerides 99 <580 mg/dL   HDL 32 (L) >99 mg/dL   Total CHOL/HDL Ratio 4.6 RATIO   VLDL 20 0 - 40 mg/dL   LDL Cholesterol 95 0 - 99 mg/dL    Comment:        Total Cholesterol/HDL:CHD Risk Coronary Heart Disease Risk Table                      Men   Women  1/2 Average Risk   3.4   3.3  Average Risk       5.0   4.4  2 X Average Risk   9.6   7.1  3 X Average Risk  23.4   11.0        Use the calculated Patient Ratio above and the CHD Risk Table to determine the patient's CHD Risk.        ATP III CLASSIFICATION (LDL):  <100     mg/dL   Optimal  833-825  mg/dL   Near or Above                    Optimal  130-159  mg/dL   Borderline  053-976  mg/dL   High  >734     mg/dL   Very High Performed at [facility], [address] [place], [place] [address]   TSH     Status: None   Collection Time: 11/10/19  6:12 AM  Result Value Ref Range   TSH 1.621 0.350 - 4.500 uIU/mL    Comment: Performed by a 3rd Generation assay with a functional sensitivity of <=0.01 uIU/mL. Performed at [facility], [address] [place], [place] [address]     Blood Alcohol level:  Lab Results  Component Value Date   ETH <10 11/09/2019    Metabolic Disorder Labs:  No results found for: HGBA1C, MPG No results found for: PROLACTIN Lab Results  Component Value Date   CHOL 147 11/10/2019   TRIG 99 11/10/2019   HDL 32 (L) 11/10/2019   CHOLHDL 4.6 11/10/2019   VLDL 20 11/10/2019   LDLCALC 95 11/10/2019    Current Medications: Current Facility-Administered Medications  Medication Dose Route Frequency Provider Last Rate Last Admin  . acetaminophen (TYLENOL) tablet 650 mg  650 mg Oral Q6H PRN Nira Conn A, NP      . alum & mag hydroxide-simeth (MAALOX/MYLANTA) 200-200-20 MG/5ML suspension 30 mL  30 mL Oral Q4H PRN Nira Conn A, NP      . finasteride (PROPECIA) tablet 1 mg  1 mg Oral QHS Nira Conn A, NP      . gabapentin (NEURONTIN) capsule 600 mg  600 mg Oral QHS Nira Conn A, NP   600 mg at 11/09/19 2358  . hydrOXYzine (ATARAX/VISTARIL) tablet 25 mg  25 mg Oral TID PRN  Rozetta Nunnery, NP      . levothyroxine (SYNTHROID) tablet 100 mcg  100 mcg Oral Daily Lindon Romp A, NP   100 mcg at  11/10/19 0606  . loperamide (IMODIUM) capsule 2-4 mg  2-4 mg Oral PRN Lindon Romp A, NP      . loratadine (CLARITIN) tablet 10 mg  10 mg Oral Daily Lindon Romp A, NP   10 mg at 11/10/19 3500  . LORazepam (ATIVAN) tablet 1 mg  1 mg Oral Q6H PRN Lindon Romp A, NP      . magnesium hydroxide (MILK OF MAGNESIA) suspension 30 mL  30 mL Oral Daily PRN Lindon Romp A, NP      . multivitamin with minerals tablet 1 tablet  1 tablet Oral Daily Lindon Romp A, NP   1 tablet at 11/10/19 [phone]  . ondansetron (ZOFRAN-ODT) disintegrating tablet 4 mg  4 mg Oral Q6H PRN Lindon Romp A, NP      . pneumococcal 23 valent vaccine (PNEUMOVAX-23) injection 0.5 mL  0.5 mL Intramuscular Tomorrow-1000 Ziyan Schoon A, MD      . pyridOXINE (VITAMIN B-6) tablet 100 mg  100 mg Oral Daily Lindon Romp A, NP      . thiamine (B-1) injection 100 mg  100 mg Intramuscular Once Lindon Romp A, NP      . thiamine tablet 100 mg  100 mg Oral Daily Lindon Romp A, NP   100 mg at 11/10/19 8299  . traZODone (DESYREL) tablet 50 mg  50 mg Oral QHS PRN Lindon Romp A, NP   50 mg at 11/09/19 2310  . vitamin B-12 (CYANOCOBALAMIN) tablet 1,000 mcg  1,000 mcg Oral Daily Lindon Romp A, NP   1,000 mcg at 11/10/19 3716   PTA Medications: Medications Prior to Admission  Medication Sig Dispense Refill Last Dose  . fexofenadine (ALLEGRA) 180 MG tablet Take 180 mg by mouth daily.     . finasteride (PROPECIA) 1 MG tablet Take 1 mg by mouth at bedtime.      . gabapentin (NEURONTIN) 300 MG capsule Take 2 capsules (600 mg total) by mouth at bedtime. 60 capsule 3   . ibuprofen (ADVIL,MOTRIN) 200 MG tablet Take 600 mg by mouth every 6 (six) hours as needed for headache, mild pain or moderate pain. For pain      . melatonin 5 MG TABS Take 10 mg by mouth at bedtime.      . Multiple Vitamin (MULTIVITAMIN WITH MINERALS) TABS Take 1 tablet by mouth daily.     Marland Kitchen pyridOXINE (VITAMIN B-6) 100 MG tablet Take 100 mg by mouth daily. [facility]     .  SYNTHROID 100 MCG tablet Take 1 tablet (100 mcg total) by mouth daily. 90 tablet 1   . thiamine 100 MG tablet Take 100 mg by mouth daily. Vitamin B1 Nature Made     . vitamin B-12 (CYANOCOBALAMIN) 1000 MCG tablet Take 1,000 mcg by mouth daily.        Musculoskeletal: Strength & Muscle Tone: within normal limits Gait & Station: normal Patient leans: N/A  Psychiatric Specialty Exam: Physical Exam  Review of Systems  Constitutional: Negative.   HENT: Negative.   Eyes: Negative.   Respiratory: Negative.  Negative for cough and shortness of breath.   Cardiovascular: Negative.   Gastrointestinal: Negative for diarrhea, nausea and vomiting.  Genitourinary: Negative.   Musculoskeletal: Negative.   Skin: Negative.  Negative for rash.  Neurological: Negative for seizures and headaches.  Hematological: Negative.  Psychiatric/Behavioral:       Anxiety, depression    Blood pressure 119/70, pulse 79, temperature (!) 97.4 F (36.3 C), temperature source Oral, resp. rate 16, height 6' 1.25" (1.861 m), weight 91.6 kg, SpO2 100 %.Body mass index is 26.47 kg/m.  General Appearance: Well Groomed  Eye Contact:  Good  Speech:  Normal Rate  Volume:  Normal  Mood:  vaguely depressed, anxious   Affect:  congruent   Thought Process:  Linear and Descriptions of Associations: Intact  Orientation:  Full (Time, Place, and Person)  Thought Content:  no hallucinations, no delusions, not internally preoccupied   Suicidal Thoughts:  No denies suicidal or self injurious ideations at this time and contracts for safety on unit , no homicidal or violent idaetions  Homicidal Thoughts:  No  Memory:  recent and remote grossly intact   Judgement:  Fair  Insight:  Fair  Psychomotor Activity:  Normal  Concentration:  Concentration: Good and Attention Span: Good  Recall:  Good  Fund of Knowledge:  Good  Language:  Good  Akathisia:  Negative  Handed:  Right  AIMS (if indicated):     Assets:  Communication  Skills Desire for Improvement Resilience  ADL's:  Intact  Cognition:  WNL  Sleep:  Number of Hours: 5    Treatment Plan Summary: Daily contact with patient to assess and evaluate symptoms and progress in treatment, Medication management, Plan inpatient admission and medications as below  Observation Level/Precautions:  15 minute checks  Laboratory:  As needed   Psychotherapy: milieu, group therapy    Medications:  Agrees to antidepressant treatment- start Zoloft 50 mgrs QDAY- side effects reviewed. Also expresses interest in starting medication for alcohol use disorder, and agrees to Campral trial. States he only drank x 1 day prior to admission and not presenting with symptoms of WDL- no indication for standing detox protocol at this time. He is no Ativan  PRN for WDL if needed Continue Neurontin at 300 mgrs BID for anxiety, alcohol use disorder    Consultations:  - as needed   Discharge Concerns: -  Estimated LOS: 3-4 days   Other:     Physician Treatment Plan for Primary Diagnosis:  Alcohol Use Disorder, Alcohol Induced Mood Disorder versus MDD  Long Term Goal(s): Improvement in symptoms so as ready for discharge  Short Term Goals: Ability to identify changes in lifestyle to reduce recurrence of condition will improve, Ability to verbalize feelings will improve, Ability to disclose and discuss suicidal ideas, Ability to demonstrate self-control will improve, Ability to identify and develop effective coping behaviors will improve, Ability to maintain clinical measurements within normal limits will improve and Compliance with prescribed medications will improve  Physician Treatment Plan for Secondary Diagnosis: Alcohol Use Disorder  Long Term Goal(s): Improvement in symptoms so as ready for discharge  Short Term Goals: Ability to identify changes in lifestyle to reduce recurrence of condition will improve and Ability to identify triggers associated with substance abuse/mental health  issues will improve  I certify that inpatient services furnished can reasonably be expected to improve the patient's condition.    Craige Cotta, MD 5/17/202110:15 AM

## 2019-11-10 NOTE — [facility] Suicide Risk Assessment (Signed)
[facility] INPATIENT:  Family/Significant Other Suicide Prevention Education  Suicide Prevention Education:  Education Completed; Miklos Bidinger (father) and Conn Trombetta (mother), has been identified by the patient as the family member/significant other with whom the patient will be residing, and identified as the person(s) who will aid the patient in the event of a mental health crisis (suicidal ideations/suicide attempt).  With written consent from the patient, the family member/significant other has been provided the following suicide prevention education, prior to the and/or following the discharge of the patient.  The suicide prevention education provided includes the following:  Suicide risk factors  Suicide prevention and interventions  National Suicide Hotline telephone number  [facility] assessment telephone number  [facility] Emergency Assistance 911  [facility] and/or Residential Mobile Crisis Unit telephone number  Request made of family/significant other to:  Remove weapons (e.g., guns, rifles, knives), all items previously/currently identified as safety concern.    Remove drugs/medications (over-the-counter, prescriptions, illicit drugs), all items previously/currently identified as a safety concern.  The family member/significant other verbalizes understanding of the suicide prevention education information provided.  The family member/significant other agrees to remove the items of safety concern listed above.  Parents informed CSW that pt already has assessment scheduled at [facility] for [facility]/11/19 at 10am.   Erin Sons 11/10/2019, 10:05 AM

## 2019-11-11 LAB — HEMOGLOBIN A1C
Hgb A1c MFr Bld: 5 % (ref 4.8–5.6)
Mean Plasma Glucose: 97 mg/dL

## 2019-11-11 NOTE — Plan of Care (Signed)
Nurse discussed anxiety, depression and coping skills with patient.  

## 2019-11-11 NOTE — Progress Notes (Signed)
D:L  Patient's self inventory sheet, patient has fair sleep, no sleep medication.  Good appetite, normal energy level, good concentration.  Rate depression and anxiety 6, hopeless 8.  Denied withdrawals.  Denied SI.  Denied physical problems.  Physical pain, stress fracture on R leg, worst pain #3 in past 24 hours.  Pain medicine helpful.  Goal is why he does self destroy activities, another day without alcohol.  Have a positive day and an open mind.  Does have discharge plans. A:  Medications administered per MD orders.  Emotional support and encouragement given patient. R;  Denied SI and HI, contracts for safety.  Denied A/V hallucinations.  Safety maintained with 15 minute checks.

## 2019-11-11 NOTE — Progress Notes (Signed)
[facility] MD Progress Note  11/11/2019 2:17 PM Patrick Hamilton  MRN:  [id number] Subjective: I feel okay.  I am not the greatest.  I am still trying to understand the processes that go on in the hospital.  I want to make sure I am present at groups but I am just not clear about the scheduling.  Objective: 34 year old male who presented to the hospital voluntarily for suicidal ideations with a plan to overdose or stab himself.  He reports the suicidal thoughts started after relapsing on alcohol.  Patient reports some neurovegetative symptoms that are ongoing to include depressed mood, anhedonia, insomnia, and hopelessness.  He reports on his daily inventory he rated his hopelessness and 8 out of 10 with 10 being the worst.  He has been started on Zoloft 50 mg p.o. daily for depression and Campral daily for alcohol cravings.  He denies any side effects to both medications at this time.  Prior to the evaluation he is observed to be reading a book, which he reports is one of his major coping skills.  At this time he denies any suicidal ideations, homicidal ideations, and or hallucinations.  He has a history of hypothyroidism which is currently being monitored, and his TSH level on admission was within normal limits.  All other labs are unremarkable at this time.  Principal Problem: <principal problem not specified> Diagnosis: Active Problems:   Severe recurrent major depression without psychotic features (HCC)  Total Time spent with patient: 30 minutes  Past Psychiatric History: denies history of prior psychiatric admissions . He reports he participated in an IOP in 2021 for depression and suicidal ideations at the time. No history of suicide attempts, no history of self cutting . No clear history of mania or hypomania. Endorses past history of depressive episodes , which he states have occurred even while abstinent . He reports recent hallucinatory experiences while withdrawing from alcohol in April but no other  history of psychosis. Reports occasional panic attacks, no agoraphobia. He reports he feels he worries excessively.  Reports history of alcohol use disorder, and just completed 7 months of sobriety prior to relapse in April.  He reports some alcohol-induced amnesia, and alcohol induced psychosis.  Past Medical History:  Past Medical History:  Diagnosis Date  . Acne   . Allergic rhinitis   . Compartment syndrome (HCC) 2010   right leg  . Depression   . Hypothyroidism   . Sever's disease     Past Surgical History:  Procedure Laterality Date  . compartment syndrome release  2010   RLE  . ED visit   2011   [place] for Alcohol related problem  . GROWTH PLATE SURGERY  ~ 2000   right foot  . LUMBAR LAMINECTOMY/DECOMPRESSION MICRODISCECTOMY  05/02/2012   Procedure: LUMBAR LAMINECTOMY/DECOMPRESSION MICRODISCECTOMY;  Surgeon: Mat Carne, MD;  Location: [facility] OR;  Service: Orthopedics;  Laterality: Right;  lumbar five-sacral one laminotomy and disc excision right side  . nose fracture    . RHINOPLASTY  1999  . TONSILLECTOMY AND ADENOIDECTOMY  2003   Family History:  Family History  Problem Relation Age of Onset  . Prostate cancer Father 79  . Autism Brother   . Diabetes Other   . Allergies Other   . Alcohol abuse Other        mom's side of the family  . Alcohol abuse Maternal Grandfather    Family Psychiatric  History: brother has autism spectrum disorder,no other mental illness in family, no  history of suicides, maternal grandfather had history of alcohol use disorder Social History:  Social History   Substance and Sexual Activity  Alcohol Use Yes  . Alcohol/week: 10.0 standard drinks  . Types: 10 Shots of liquor per week   Comment: Drinks a 5th daily     Social History   Substance and Sexual Activity  Drug Use No    Social History   Socioeconomic History  . Marital status: Single    Spouse name: Not on file  . Number of children: Not on file  . Years of  education: Not on file  . Highest education level: Not on file  Occupational History  . Not on file  Tobacco Use  . Smoking status: Former Smoker    Types: Cigarettes    Quit date: 09/26/2019    Years since quitting: 0.1  . Smokeless tobacco: Former NeurosurgeonUser    Types: Snuff  . Tobacco comment: 04/30/2012 "smoked & used snuff maybe for 1 wk total"  Substance and Sexual Activity  . Alcohol use: Yes    Alcohol/week: 10.0 standard drinks    Types: 10 Shots of liquor per week    Comment: Drinks a 5th daily  . Drug use: No  . Sexual activity: Yes    Birth control/protection: None  Other Topics Concern  . 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 Difficulty of Paying Living Expenses:   Food Insecurity:   . Worried About Programme researcher, broadcasting/film/videounning Out of Food in the Last Year:   . Baristaan Out of Food in the Last Year:   Transportation Needs:   . Freight forwarderLack of Transportation (Medical):   Marland Kitchen. Lack of Transportation (Non-Medical):   Physical Activity:   . Days of Exercise per Week:   . Minutes of Exercise per Session:   Stress:   . Feeling of Stress :   Social Connections:   . Frequency of Communication with Friends and Family:   . Frequency of Social Gatherings with Friends and Family:   . Attends Religious Services:   . Active Member of Clubs or Organizations:   . Attends BankerClub or Organization Meetings:   Marland Kitchen. Marital Status:    Additional Social History:          Sleep: Fair  Appetite:  Fair  Current Medications: Current Facility-Administered Medications  Medication Dose Route Frequency Provider Last Rate Last Admin  . acamprosate (CAMPRAL) tablet 666 mg  666 mg Oral TID WC Cobos, Rockey SituFernando A, MD   666 mg at 11/11/19 1224  . acetaminophen (TYLENOL) tablet 650 mg  650 mg Oral Q6H PRN  Nira ConnBerry, Jason A, NP   650 mg at 11/11/19 1230  . alum & mag hydroxide-simeth (MAALOX/MYLANTA) 200-200-20 MG/5ML suspension 30 mL  30 mL Oral Q4H PRN Nira ConnBerry, Jason A, NP      . finasteride (PROPECIA) tablet 1 mg  1 mg Oral QHS Nira ConnBerry, Jason A, NP      . gabapentin (NEURONTIN) capsule 600 mg  600 mg Oral QHS Nira ConnBerry, Jason A, NP   600 mg at 11/10/19 2118  . hydrOXYzine (ATARAX/VISTARIL) tablet 25 mg  25 mg Oral TID PRN Jackelyn PolingBerry, Jason A, NP      .  levothyroxine (SYNTHROID) tablet 100 mcg  100 mcg Oral Daily Nira Conn A, NP   100 mcg at 11/11/19 2774  . LORazepam (ATIVAN) tablet 1 mg  1 mg Oral Q6H PRN Nira Conn A, NP      . magnesium hydroxide (MILK OF MAGNESIA) suspension 30 mL  30 mL Oral Daily PRN Nira Conn A, NP      . multivitamin with minerals tablet 1 tablet  1 tablet Oral Daily Nira Conn A, NP   1 tablet at 11/11/19 0800  . pneumococcal 23 valent vaccine (PNEUMOVAX-23) injection 0.5 mL  0.5 mL Intramuscular Tomorrow-1000 Cobos, Fernando A, MD      . pyridOXINE (VITAMIN B-6) tablet 100 mg  100 mg Oral Daily Nira Conn A, NP   100 mg at 11/11/19 0800  . sertraline (ZOLOFT) tablet 50 mg  50 mg Oral Daily Cobos, Rockey Situ, MD   50 mg at 11/11/19 0802  . thiamine (B-1) injection 100 mg  100 mg Intramuscular Once Nira Conn A, NP      . thiamine tablet 100 mg  100 mg Oral Daily Nira Conn A, NP   100 mg at 11/11/19 0801  . traZODone (DESYREL) tablet 50 mg  50 mg Oral QHS PRN Nira Conn A, NP   50 mg at 11/09/19 2310  . vitamin B-12 (CYANOCOBALAMIN) tablet 1,000 mcg  1,000 mcg Oral Daily Nira Conn A, NP   1,000 mcg at 11/11/19 1287    Lab Results:  Results for orders placed or performed during the hospital encounter of 11/09/19 (from the past 48 hour(s))  Hemoglobin A1c     Status: None   Collection Time: 11/10/19  6:12 AM  Result Value Ref Range   Hgb A1c MFr Bld 5.0 4.8 - 5.6 %    Comment: (NOTE)         Prediabetes: 5.7 - 6.4         Diabetes: >6.4         Glycemic  control for adults with diabetes: <7.0    Mean Plasma Glucose 97 mg/dL    Comment: (NOTE) Performed At: [facility] [address] [place], [place] [id number] Jolene Schimke MD BS:[id number]   Lipid panel     Status: Abnormal   Collection Time: 11/10/19  6:12 AM  Result Value Ref Range   Cholesterol 147 0 - 200 mg/dL   Triglycerides 99 <765 mg/dL   HDL 32 (L) >46 mg/dL   Total CHOL/HDL Ratio 4.6 RATIO   VLDL 20 0 - 40 mg/dL   LDL Cholesterol 95 0 - 99 mg/dL    Comment:        Total Cholesterol/HDL:CHD Risk Coronary Heart Disease Risk Table                     Men   Women  1/2 Average Risk   3.4   3.3  Average Risk       5.0   4.4  2 X Average Risk   9.6   7.1  3 X Average Risk  23.4   11.0        Use the calculated Patient Ratio above and the CHD Risk Table to determine the patient's CHD Risk.        ATP III CLASSIFICATION (LDL):  <100     mg/dL   Optimal  503-546  mg/dL   Near or Above  Optimal  130-159  mg/dL   Borderline  160-189  mg/dL   High  >190     mg/dL   Very High Performed at [facility] [address] [place], [place] [address]   TSH     Status: None   Collection Time: 11/10/19  6:12 AM  Result Value Ref Range   TSH 1.621 0.350 - 4.500 uIU/mL    Comment: Performed by a 3rd Generation assay with a functional sensitivity of <=0.01 uIU/mL. Performed at [facility], [place] [address], [place], [place] [address]     Blood Alcohol level:  Lab Results  Component Value Date   ETH <10 81/06/7508    Metabolic Disorder Labs: Lab Results  Component Value Date   HGBA1C 5.0 11/10/2019   MPG 97 11/10/2019   No results found for: PROLACTIN Lab Results  Component Value Date   CHOL 147 11/10/2019   TRIG 99 11/10/2019   HDL 32 (L) 11/10/2019   CHOLHDL 4.6 11/10/2019   VLDL 20 11/10/2019   LDLCALC 95 11/10/2019    Physical Findings: AIMS: Facial and Oral Movements Muscles of Facial  Expression: None, normal Lips and Perioral Area: None, normal Jaw: None, normal Tongue: None, normal,Extremity Movements Upper (arms, wrists, hands, fingers): None, normal Lower (legs, knees, ankles, toes): None, normal, Trunk Movements Neck, shoulders, hips: None, normal, Overall Severity Severity of abnormal movements (highest score from questions above): None, normal Incapacitation due to abnormal movements: None, normal Patient's awareness of abnormal movements (rate only patient's report): No Awareness, Dental Status Current problems with teeth and/or dentures?: No Does patient usually wear dentures?: No  CIWA:  CIWA-Ar Total: 1 COWS:     Musculoskeletal: Strength & Muscle Tone: within normal limits Gait & Station: normal Patient leans: N/A  Psychiatric Specialty Exam: Physical Exam  Review of Systems  Blood pressure 129/70, pulse 67, temperature 98.2 F (36.8 C), resp. rate 16, height 6' 1.25" (1.861 m), weight 91.6 kg, SpO2 100 %.Body mass index is 26.47 kg/m.  General Appearance: Fairly Groomed  Eye Contact:  Fair  Speech:  Clear and Coherent and Normal Rate  Volume:  Normal  Mood:  Depressed  Affect:  Appropriate and Congruent  Thought Process:  Coherent, Linear and Descriptions of Associations: Intact  Orientation:  Full (Time, Place, and Person)  Thought Content:  WDL  Suicidal Thoughts:  Passive suicidal ideations  Homicidal Thoughts:  No  Memory:  Immediate;   Fair Recent;   Fair  Judgement:  Intact  Insight:  Fair  Psychomotor Activity:  Normal  Concentration:  Concentration: Fair and Attention Span: Fair  Recall:  [employer] of Knowledge:  Fair  Language:  Fair  Akathisia:  No  Handed:  Right  AIMS (if indicated):     Assets:  Communication Skills Desire for Improvement Financial Resources/Insurance Housing Leisure Time Physical Health Social Support Talents/Skills Vocational/Educational  ADL's:  Intact  Cognition:  WNL  Sleep:  Number of  Hours: 6.75     Treatment Plan Summary: Daily contact with patient to assess and evaluate symptoms and progress in treatment and Medication management Will continue Zoloft 50 mg p.o. daily for depression and anxiety.  Denies any side effects or acute nausea and vomiting related to medication.  Patient is also agreed to continue Campral trial.  Patient was not placed on CIWA protocol upon admission due to no presenting symptoms and patient denied daily use of alcohol prior to this day.  Will continue Neurontin at  300 mg p.o. twice daily for anxiety and alcohol use disorder.  Will resume home medications at this time.  Discharge planning is in place,, will continue to work with treatment team and prepare for discharge accordingly. Maryagnes Amos, FNP 11/11/2019, 2:17 PM

## 2019-11-11 NOTE — Progress Notes (Signed)
   11/11/19 2200  Psych Admission Type (Psych Patients Only)  Admission Status Voluntary  Psychosocial Assessment  Patient Complaints Anxiety  Eye Contact Fair  Facial Expression Anxious  Affect Appropriate to circumstance  Speech Logical/coherent  Interaction Assertive  Motor Activity Other (Comment) (WDL)  Appearance/Hygiene In scrubs;Disheveled  Behavior Characteristics Anxious  Mood Depressed  Thought Process  Coherency WDL  Content WDL  Delusions None reported or observed  Perception WDL  Hallucination None reported or observed  Judgment Poor  Confusion None  Danger to Self  Current suicidal ideation? Denies  Danger to Others  Danger to Others None reported or observed

## 2019-11-11 NOTE — Plan of Care (Signed)
  Problem: Activity: Goal: Interest or engagement in activities will improve Outcome: Progressing   Problem: Coping: Goal: Ability to verbalize frustrations and anger appropriately will improve Outcome: Progressing   Problem: Safety: Goal: Periods of time without injury will increase Outcome: Progressing   

## 2019-11-11 NOTE — Progress Notes (Signed)
Recreation Therapy Notes  Animal-Assisted Activity (AAA) Program Checklist/Progress Notes Patient Eligibility Criteria Checklist & Daily Group note for Rec Tx Intervention  Date: 5.18.21 Time: 1430 Location: [address]   AAA/T Program Assumption of Risk Form signed by Engineer, production or Parent Legal Guardian  YES   Patient is free of allergies or sever asthma  YES   Patient reports no fear of animals  YES   Patient reports no history of cruelty to animals YES   Patient understands his/her participation is voluntary YES  Patient washes hands before animal contact  YES   Patient washes hands after animal contact  YES   Behavioral Response: Engaged  Education: Charity fundraiser, Appropriate Animal Interaction   Education Outcome: Acknowledges understanding/In group clarification offered/Needs additional education.   Clinical Observations/Feedback: Pt attended and participated in group activity.    Caroll Rancher, LRT/CTRS    Caroll Rancher A 11/11/2019 3:25 PM

## 2019-11-11 NOTE — Progress Notes (Signed)
Pt reports good day, attended group, went out for fresh air and enjoyed. Pt observed interacting well with peers. Took all his meds as scheduled, denied physical pain, SI/HI and contracted for safety, will continue to monitor.

## 2019-11-12 DIAGNOSIS — F332 Major depressive disorder, recurrent severe without psychotic features: Secondary | ICD-10-CM | POA: Diagnosis not present

## 2019-11-12 MED ORDER — FINASTERIDE 1 MG PO TABS: 1.0000 mg | ORAL_TABLET | Freq: Every day | ORAL | Status: DC

## 2019-11-12 NOTE — Progress Notes (Signed)
[facility] MD Progress Note  11/12/2019 3:51 PM Patrick Hamilton  MRN:  [id number]  Subjective: Patrick Hamilton reports, "I have been here since Sunday due to suicidal ideations. I guess I'm okay, still with depression. I have not had a good appetite since starting on the medications".  Objective: 34 year old male who presented to the hospital voluntarily for suicidal ideations with a plan to overdose or stab himself.  He reports the suicidal thoughts started after relapsing on alcohol.  Patient reports some neurovegetative symptoms that are ongoing to include depressed mood, anhedonia, insomnia, and hopelessness.   Patrick Hamilton is seen, chart reviewed. The chart findings discussed with the treatment team. He presents alert, oriented & aware of situation. He is sitting on the bench-chair in his room completing his inventory sheet. He says he is in his room because he does not like to socialize with people. However, he did says that he will attend group sessions when ever it starts this morning. He says he has been in this hospital since Sunday due to suicidal ideations. Today, he denies any SI, but continues to endorse depressive symptoms. He is complaining of poor appetite since starting his medications. He says he slept fairly last night. He rates his depression/anxiety both #7 in the scale of (1- 10) with 10 being the worst depression & 1 being the least.  He is taking & tolerating his medications.  He denies any side effects.  At this time, denies any suicidal ideations, homicidal ideations, auditory, visual hallucinations, delusions or paranoia.  He does not appear to be responding to any internal stimuli. He has a history of hypothyroidism, remains on Synthroid.  His TSH level on admission was within normal limits.  All other labs are unremarkable at this time.Patrick Hamilton is in agreement to continue his current plan of care as already in progress.  Principal Problem: Severe recurrent major depression without psychotic features  (HCC)  Diagnosis: Principal Problem:   Severe recurrent major depression without psychotic features (HCC)  Total Time spent with patient: 15 minutes  Past Psychiatric History: denies history of prior psychiatric admissions. Reports participated in an IOP in 2021 for depression & suicidal ideations.  No history of suicide attempts. No hx of self-cutting behaviors.  No clear history of mania or hypomania.  Endorses past history of depressive episodes. Reports recent hallucinatory experiences during alcohol withdrawal in April of this year, 2021. Reports occasional panic attacks/excessive worrying, denies agoraphobia.  Reports hx. of alcohol use disorder, just completed 7 months of sobriety prior to relapse in April.  He reports some alcohol-induced amnesia/psychosis.  Past Medical History:  Past Medical History:  Diagnosis Date  . Acne   . Allergic rhinitis   . Compartment syndrome (HCC) 2010   right leg  . Depression   . Hypothyroidism   . Sever's disease     Past Surgical History:  Procedure Laterality Date  . compartment syndrome release  2010   RLE  . ED visit   2011   [place] for Alcohol related problem  . GROWTH PLATE SURGERY  ~ 2000   right foot  . LUMBAR LAMINECTOMY/DECOMPRESSION MICRODISCECTOMY  05/02/2012   Procedure: LUMBAR LAMINECTOMY/DECOMPRESSION MICRODISCECTOMY;  Surgeon: Mat Carne, MD;  Location: [facility] OR;  Service: Orthopedics;  Laterality: Right;  lumbar five-sacral one laminotomy and disc excision right side  . nose fracture    . RHINOPLASTY  1999  . TONSILLECTOMY AND ADENOIDECTOMY  2003   Family History:  Family History  Problem Relation Age of Onset  .  Prostate cancer Father [id number]  . Autism Brother   . Diabetes Other   . Allergies Other   . Alcohol abuse Other        mom's side of the family  . Alcohol abuse Maternal Grandfather    Family Psychiatric  History: Autism spectrum: Brother.                                                  Alcoholism: Maternal grand-father.                                                 Denies hx of suicide in the family.  Social History:  Social History   Substance and Sexual Activity  Alcohol Use Yes  . Alcohol/week: 10.0 standard drinks  . Types: 10 Shots of liquor per week   Comment: Drinks a 5th daily     Social History   Substance and Sexual Activity  Drug Use No    Social History   Socioeconomic History  . Marital status: Single    Spouse name: Not on file  . Number of children: Not on file  . Years of education: Not on file  . Highest education level: Not on file  Occupational History  . Not on file  Tobacco Use  . Smoking status: Former Smoker    Types: Cigarettes    Quit date: 09/26/2019    Years since quitting: 0.1  . Smokeless tobacco: Former NeurosurgeonUser    Types: Snuff  . Tobacco comment: 04/30/2012 "smoked & used snuff maybe for 1 wk total"  Substance and Sexual Activity  . Alcohol use: Yes    Alcohol/week: 10.0 standard drinks    Types: 10 Shots of liquor per week    Comment: Drinks a 5th daily  . Drug use: No  . Sexual activity: Yes    Birth control/protection: None  Other Topics Concern  . 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 Difficulty of Paying Living Expenses:   Food Insecurity:   . Worried About Programme researcher, broadcasting/film/videounning Out of Food in the Last Year:   . Baristaan Out of Food in the Last Year:   Transportation Needs:   . Freight forwarderLack of Transportation (Medical):   Marland Kitchen. Lack of Transportation (Non-Medical):   Physical Activity:   . Days of Exercise per Week:   . Minutes of Exercise per Session:   Stress:   . Feeling of Stress :   Social Connections:   . Frequency of Communication with Friends and Family:   . Frequency of Social Gatherings with  Friends and Family:   . Attends Religious Services:   . Active Member of Clubs or Organizations:   . Attends BankerClub or Organization Meetings:   Marland Kitchen. Marital Status:    Additional Social History:   Sleep: Fair  Appetite:  "My appetite is not good"  Current Medications: Current Facility-Administered Medications  Medication Dose Route  Frequency Provider Last Rate Last Admin  . acamprosate (CAMPRAL) tablet 666 mg  666 mg Oral TID WC Cobos, Rockey Situ, MD   666 mg at 11/12/19 1219  . acetaminophen (TYLENOL) tablet 650 mg  650 mg Oral Q6H PRN Nira Conn A, NP   650 mg at 11/11/19 1230  . alum & mag hydroxide-simeth (MAALOX/MYLANTA) 200-200-20 MG/5ML suspension 30 mL  30 mL Oral Q4H PRN Nira Conn A, NP      . finasteride (PROPECIA) tablet 1 mg  1 mg Oral QHS Roemello Speyer I, NP      . gabapentin (NEURONTIN) capsule 600 mg  600 mg Oral QHS Nira Conn A, NP   600 mg at 11/11/19 2129  . hydrOXYzine (ATARAX/VISTARIL) tablet 25 mg  25 mg Oral TID PRN Jackelyn Poling, NP   25 mg at 11/11/19 2129  . levothyroxine (SYNTHROID) tablet 100 mcg  100 mcg Oral Daily Nira Conn A, NP   100 mcg at 11/12/19 1191  . LORazepam (ATIVAN) tablet 1 mg  1 mg Oral Q6H PRN Nira Conn A, NP      . magnesium hydroxide (MILK OF MAGNESIA) suspension 30 mL  30 mL Oral Daily PRN Nira Conn A, NP      . multivitamin with minerals tablet 1 tablet  1 tablet Oral Daily Nira Conn A, NP   1 tablet at 11/12/19 0816  . pneumococcal 23 valent vaccine (PNEUMOVAX-23) injection 0.5 mL  0.5 mL Intramuscular Tomorrow-1000 Cobos, Fernando A, MD      . pyridOXINE (VITAMIN B-6) tablet 100 mg  100 mg Oral Daily Nira Conn A, NP   100 mg at 11/12/19 0816  . sertraline (ZOLOFT) tablet 50 mg  50 mg Oral Daily Cobos, Rockey Situ, MD   50 mg at 11/12/19 0816  . thiamine (B-1) injection 100 mg  100 mg Intramuscular Once Nira Conn A, NP      . thiamine tablet 100 mg  100 mg Oral Daily Nira Conn A, NP   100 mg at 11/12/19 0816  .  traZODone (DESYREL) tablet 50 mg  50 mg Oral QHS PRN Nira Conn A, NP   50 mg at 11/11/19 2129  . vitamin B-12 (CYANOCOBALAMIN) tablet 1,000 mcg  1,000 mcg Oral Daily Nira Conn A, NP   1,000 mcg at 11/12/19 4782   Lab Results:  No results found for this or any previous visit (from the past 48 hour(s)).  Blood Alcohol level:  Lab Results  Component Value Date   ETH <10 11/09/2019   Metabolic Disorder Labs: Lab Results  Component Value Date   HGBA1C 5.0 11/10/2019   MPG 97 11/10/2019   No results found for: PROLACTIN Lab Results  Component Value Date   CHOL 147 11/10/2019   TRIG 99 11/10/2019   HDL 32 (L) 11/10/2019   CHOLHDL 4.6 11/10/2019   VLDL 20 11/10/2019   LDLCALC 95 11/10/2019   Physical Findings: AIMS: Facial and Oral Movements Muscles of Facial Expression: None, normal Lips and Perioral Area: None, normal Jaw: None, normal Tongue: None, normal,Extremity Movements Upper (arms, wrists, hands, fingers): None, normal Lower (legs, knees, ankles, toes): None, normal, Trunk Movements Neck, shoulders, hips: None, normal, Overall Severity Severity of abnormal movements (highest score from questions above): None, normal Incapacitation due to abnormal movements: None, normal Patient's awareness of abnormal movements (rate only patient's report): No Awareness, Dental Status Current problems with teeth and/or dentures?: No Does patient usually wear dentures?: No  CIWA:  CIWA-Ar Total:  1 COWS:     Musculoskeletal: Strength & Muscle Tone: within normal limits Gait & Station: normal Patient leans: N/A  Psychiatric Specialty Exam: Physical Exam  Nursing note and vitals reviewed. Constitutional: He is oriented to person, place, and time. He appears well-developed.  Eyes: Pupils are equal, round, and reactive to light.  Cardiovascular: Normal rate.  Respiratory: Effort normal.  Genitourinary:    Genitourinary Comments: Deferred   Musculoskeletal:        General:  Normal range of motion.     Cervical back: Normal range of motion.  Neurological: He is alert and oriented to person, place, and time.  Skin: Skin is warm and dry.    Review of Systems  Constitutional: Negative.   HENT: Negative.   Respiratory: Negative.   Cardiovascular: Negative.   Gastrointestinal: Negative.   Genitourinary: Negative for difficulty urinating.  Musculoskeletal: Negative.   Allergic/Immunologic: Negative for environmental allergies and food allergies.       Allergies: NKDA  Neurological: Negative.   Psychiatric/Behavioral: Positive for dysphoric mood ("Improving"). Negative for agitation, behavioral problems, confusion, hallucinations, self-injury, sleep disturbance and suicidal ideas. The patient is not nervous/anxious and is not hyperactive.     Blood pressure 118/73, pulse 73, temperature (!) 97.5 F (36.4 C), temperature source Oral, resp. rate 16, height 6' 1.25" (1.861 m), weight 91.6 kg, SpO2 100 %.Body mass index is 26.47 kg/m.  General Appearance: Fairly Groomed  Eye Contact:  Fair  Speech:  Clear and Coherent and Normal Rate  Volume:  Normal  Mood:  "I'm still feeling depressed"  Affect:  Appropriate and reactive.  Thought Process:  Coherent, Linear and Descriptions of Associations: Intact  Orientation:  Full (Time, Place, and Person)  Thought Content:  Logical, denies any hallucinations, delusions or paranoia.  Suicidal Thoughts:  Currently denies any thoughts, plans or intent.  Homicidal Thoughts:  Denies  Memory:  Immediate;   Fair Recent;   Fair  Judgement:  Intact  Insight:  Fair  Psychomotor Activity:  Normal  Concentration:  Concentration: Fair and Attention Span: Fair  Recall:  [employer] of Knowledge:  Fair  Language:  Fair  Akathisia:  No  Handed:  Right  AIMS (if indicated):     Assets:  Communication Skills Desire for Improvement Financial Resources/Insurance Physical Health  ADL's:  Intact  Cognition:  WNL  Sleep:  Number of  Hours: 6.75   Treatment Plan Summary: Daily contact with patient to assess and evaluate symptoms and progress in treatment and Medication management   - Continue inpatient hospitalization. - Will continue today 11/12/2019 plan as below except where it is noted.  Depression.    - Continue sertraline 50 mg po daily.  Alcohol;ism.    - Continue Campral 666 mg po tid with meals.  Anxiety/agitation.    - Continue gabapentin 600 mg po Q bedtime.    - Continue Vistaril 25 mg po tid prn.    - Continue Ativan 1 mg po Q 6 hrs prn for CIWA > 10.  Insomnia.    - Continue Trazodone 50 mg po Q hs prn.  Other medical issues.    - finasteride 1 mg po Q hs for alopecia.    - Continue Synthroid 100 mcg po Q am for hypothyroidism.    - pyridoxine 100 mg po daily for Vit. B-6 replacement.    - Continue thiamine 100 mg po daily for thiamine deficiency.    - Continue Cyanocobalamin 1,000 mcg po daily for B-12 replacement.  Encourage group participation. Discharge disposition in progress.  Lindell Spar, NP, PMHNP, FNP-BC. 11/12/2019, 3:51 PMPatient ID: Kenney Houseman, male   DOB: 07-08-1985, 34 y.o.   MRN: [id number]

## 2019-11-12 NOTE — [facility] Group Notes (Signed)
11/12/2019 8:45am Type of Group and Topic: Psychoeducational Group: Discharge Planning  Participation Level: Active  Description of Group Discharge planning group reviews patient's anticipated discharge plans and assists patients to anticipate and address any barriers to wellness/recovery in the community. Suicide prevention education is reviewed with patients in group. Therapeutic Goals 1. Patients will state their anticipated discharge plan and mental health aftercare 2. Patients will identify potential barriers to wellness in the community setting 3. Patients will engage in problem solving, solution focused discussion of ways to anticipate and address barriers to wellness/recovery   Summary of Patient Progress Plan for Discharge/Comments: Patrick Hamilton report he does not have any questions or concerns regarding his discharge plans.   Transportation Means: Parents    Supports: Parents    Therapeutic Modalities: Motivational Interviewing     Patrick Hamilton, MSW, LCSWA Clinical Social Worker [facility]  Phone: [phone] 11/12/2019 2:08 PM

## 2019-11-12 NOTE — Progress Notes (Signed)
   11/12/19 0618  Vital Signs  Pulse Rate 73  BP 118/73  BP Location Left Arm  BP Method Automatic  Patient Position (if appropriate) Standing  Sleep  Number of Hours 6.75  DD: Patient Presents with an anxious affect.  Patient was calm and cooperative during med pass and took his medicine without incident.  Patient was out in open areas and was social with peers and staff.  Patient denies suicidal thoughts and self harming thoughts. A:  Patient took scheduled medicine.  Support and encouragement provided Routine safety checks conducted every 15 minutes. Patient  Informed to notify staff with any concerns.  Safety maintained .R:  No adverse drug reactions noted.  Patient contracts for safety.  Patient compliant with medication and treatment plan. Patient cooperative and calm. Patient interacts well with others on the unit.  Safety maintained.

## 2019-11-12 NOTE — [facility] Group Notes (Signed)
Adult Psychoeducational Group Note  Date:  11/12/2019 Time:  9:11 PM  Group Topic/Focus:  Wrap-Up Group:   The focus of this group is to help patients review their daily goal of treatment and discuss progress on daily workbooks.  Participation Level:  Active  Participation Quality:  Appropriate and Attentive  Affect:  Appropriate  Cognitive:  Alert and Appropriate  Insight: Appropriate and Good  Engagement in Group:  Engaged  Modes of Intervention:  Discussion and Education  Additional Comments:  Pt attended and participated in wrap up group this evening and rated their day a 5/10 due to them going to groups. Pt completed their goal, which was to remain positive about their personal relationships and newly learned tools.    Chrisandra Netters 11/12/2019, 9:11 PM

## 2019-11-12 NOTE — Progress Notes (Signed)
   11/12/19 2100  Psych Admission Type (Psych Patients Only)  Admission Status Voluntary  Psychosocial Assessment  Patient Complaints Anxiety  Eye Contact Fair  Facial Expression Anxious  Affect Appropriate to circumstance  Speech Logical/coherent  Interaction Assertive  Motor Activity Other (Comment) (WDL)  Appearance/Hygiene In scrubs;Disheveled  Behavior Characteristics Anxious  Mood Depressed  Thought Process  Coherency WDL  Content WDL  Delusions None reported or observed  Perception WDL  Hallucination None reported or observed  Judgment Poor  Confusion None  Danger to Self  Current suicidal ideation? Denies  Danger to Others  Danger to Others None reported or observed

## 2019-11-13 NOTE — Plan of Care (Signed)
Nurse discussed anxiety, depression and coping skills with patient.  

## 2019-11-13 NOTE — Progress Notes (Signed)
[facility] MD Progress Note  11/13/2019 10:28 AM DIRK VANAMAN  MRN:  [id number]  Subjective: Laksh reports, "I'm doing okay, not great, not bad but right in the middle. I'm just wriied about the future, the unknown I guess"  Objective: 34 year old male who presented to the hospital voluntarily for suicidal ideations with a plan to overdose or stab himself.  He reports the suicidal thoughts started after relapsing on alcohol.  Patient reports some neurovegetative symptoms that are ongoing to include depressed mood, anhedonia, insomnia, and hopelessness.   Dhruv is seen, chart reviewed. The chart findings discussed with the treatment team. He presents alert, oriented & aware of situation. He is sitting on the bench-chair in the cubby-room on the hallway across from the nurses station reading a book. He says he is doing okay, not great, not bad, but right in the middle. He also says he is worried about the future, the unknown part of it. He says he is looking forward to participating in the IOP at the [facility] after discharge. He says he would like to know more about this program prior to discharge. SW is notified about patient's request. He reports improved appetite, says sleep was okay last night. He rates his depression & anxiety at #4 today in the scale of (1- 10) with 10 being the worst depression & 1 being the least.  He is taking & tolerating his medications.  He denies any side effects.  At this time, denies any suicidal ideations, homicidal ideations, auditory, visual hallucinations, delusions or paranoia.  He does not appear to be responding to any internal stimuli. He has a history of hypothyroidism, remains on Synthroid.  His TSH level on admission was within normal limits.  All other labs are unremarkable at this time.Viliami is in agreement to continue his current plan of care as already in progress.  Principal Problem: Severe recurrent major depression without psychotic features  (HCC)  Diagnosis: Principal Problem:   Severe recurrent major depression without psychotic features (HCC)  Total Time spent with patient: 15 minutes  Past Psychiatric History: denies history of prior psychiatric admissions. Reports participated in an IOP in 2021 for depression & suicidal ideations.  No history of suicide attempts. No hx of self-cutting behaviors.  No clear history of mania or hypomania.  Endorses past history of depressive episodes. Reports recent hallucinatory experiences during alcohol withdrawal in April of this year, 2021. Reports occasional panic attacks/excessive worrying, denies agoraphobia.  Reports hx. of alcohol use disorder, just completed 7 months of sobriety prior to relapse in April.  He reports some alcohol-induced amnesia/psychosis.  Past Medical History:  Past Medical History:  Diagnosis Date  . Acne   . Allergic rhinitis   . Compartment syndrome (HCC) 2010   right leg  . Depression   . Hypothyroidism   . Sever's disease     Past Surgical History:  Procedure Laterality Date  . compartment syndrome release  2010   RLE  . ED visit   2011   [facility] for Alcohol related problem  . GROWTH PLATE SURGERY  ~ 2000   right foot  . LUMBAR LAMINECTOMY/DECOMPRESSION MICRODISCECTOMY  05/02/2012   Procedure: LUMBAR LAMINECTOMY/DECOMPRESSION MICRODISCECTOMY;  Surgeon: Mat Carne, MD;  Location: [facility] OR;  Service: Orthopedics;  Laterality: Right;  lumbar five-sacral one laminotomy and disc excision right side  . nose fracture    . RHINOPLASTY  1999  . TONSILLECTOMY AND ADENOIDECTOMY  2003   Family History:  Family History  Problem Relation Age of Onset  . Prostate cancer Father 20  . Autism Brother   . Diabetes Other   . Allergies Other   . Alcohol abuse Other        mom's side of the family  . Alcohol abuse Maternal Grandfather    Family Psychiatric  History: Autism spectrum: Brother.                                                  Alcoholism: Maternal grand-father.                                                 Denies hx of suicide in the family.  Social History:  Social History   Substance and Sexual Activity  Alcohol Use Yes  . Alcohol/week: 10.0 standard drinks  . Types: 10 Shots of liquor per week   Comment: Drinks a 5th daily     Social History   Substance and Sexual Activity  Drug Use No    Social History   Socioeconomic History  . Marital status: Single    Spouse name: Not on file  . Number of children: Not on file  . Years of education: Not on file  . Highest education level: Not on file  Occupational History  . Not on file  Tobacco Use  . Smoking status: Former Smoker    Types: Cigarettes    Quit date: 09/26/2019    Years since quitting: 0.1  . Smokeless tobacco: Former Neurosurgeon    Types: Snuff  . Tobacco comment: 04/30/2012 "smoked & used snuff maybe for 1 wk total"  Substance and Sexual Activity  . Alcohol use: Yes    Alcohol/week: 10.0 standard drinks    Types: 10 Shots of liquor per week    Comment: Drinks a 5th daily  . Drug use: No  . Sexual activity: Yes    Birth control/protection: None  Other Topics Concern  . 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 Difficulty of Paying Living Expenses:   Food Insecurity:   . Worried About Programme researcher, broadcasting/film/video in the Last Year:   . Barista in the Last Year:   Transportation Needs:   . Freight forwarder (Medical):   Marland Kitchen Lack of Transportation (Non-Medical):   Physical Activity:   . Days of Exercise per Week:   . Minutes of Exercise per Session:   Stress:   . Feeling of Stress :   Social Connections:   . Frequency of Communication with Friends and Family:   . Frequency of Social Gatherings with  Friends and Family:   . Attends Religious Services:   . Active Member of Clubs or Organizations:   . Attends Banker Meetings:   Marland Kitchen Marital Status:    Additional Social History:   Sleep: Fair  Appetite:  "Improving"  Current Medications: Current Facility-Administered Medications  Medication Dose Route Frequency Provider Last Rate Last Admin  . acamprosate (CAMPRAL) tablet 666 mg  666 mg Oral TID WC Cobos, Rockey Situ, MD   666 mg at 11/13/19 0610  . acetaminophen (TYLENOL) tablet 650 mg  650 mg Oral Q6H PRN Jackelyn Poling, NP   650 mg at 11/12/19 1810  . alum & mag hydroxide-simeth (MAALOX/MYLANTA) 200-200-20 MG/5ML suspension 30 mL  30 mL Oral Q4H PRN Nira Conn A, NP      . finasteride (PROPECIA) tablet 1 mg  1 mg Oral QHS Manjot Hinks I, NP      . gabapentin (NEURONTIN) capsule 600 mg  600 mg Oral QHS Nira Conn A, NP   600 mg at 11/12/19 2112  . hydrOXYzine (ATARAX/VISTARIL) tablet 25 mg  25 mg Oral TID PRN Jackelyn Poling, NP   25 mg at 11/12/19 2112  . levothyroxine (SYNTHROID) tablet 100 mcg  100 mcg Oral Daily Nira Conn A, NP   100 mcg at 11/13/19 0610  . magnesium hydroxide (MILK OF MAGNESIA) suspension 30 mL  30 mL Oral Daily PRN Nira Conn A, NP      . multivitamin with minerals tablet 1 tablet  1 tablet Oral Daily Nira Conn A, NP   1 tablet at 11/13/19 0726  . pneumococcal 23 valent vaccine (PNEUMOVAX-23) injection 0.5 mL  0.5 mL Intramuscular Tomorrow-1000 Cobos, Fernando A, MD      . pyridOXINE (VITAMIN B-6) tablet 100 mg  100 mg Oral Daily Nira Conn A, NP   100 mg at 11/13/19 0727  . sertraline (ZOLOFT) tablet 50 mg  50 mg Oral Daily Cobos, Rockey Situ, MD   50 mg at 11/13/19 0727  . thiamine (B-1) injection 100 mg  100 mg Intramuscular Once Nira Conn A, NP      . thiamine tablet 100 mg  100 mg Oral Daily Nira Conn A, NP   100 mg at 11/13/19 0726  . traZODone (DESYREL) tablet 50 mg  50 mg Oral QHS PRN Nira Conn A, NP   50 mg at 11/12/19  2112  . vitamin B-12 (CYANOCOBALAMIN) tablet 1,000 mcg  1,000 mcg Oral Daily Nira Conn A, NP   1,000 mcg at 11/13/19 8250   Lab Results:  No results found for this or any previous visit (from the past 48 hour(s)).  Blood Alcohol level:  Lab Results  Component Value Date   ETH <10 11/09/2019   Metabolic Disorder Labs: Lab Results  Component Value Date   HGBA1C 5.0 11/10/2019   MPG 97 11/10/2019   No results found for: PROLACTIN Lab Results  Component Value Date   CHOL 147 11/10/2019   TRIG 99 11/10/2019   HDL 32 (L) 11/10/2019   CHOLHDL 4.6 11/10/2019   VLDL 20 11/10/2019   LDLCALC 95 11/10/2019   Physical Findings: AIMS: Facial and Oral Movements Muscles of Facial Expression: None, normal Lips and Perioral Area: None, normal Jaw: None, normal Tongue: None, normal,Extremity Movements Upper (arms, wrists, hands, fingers): None, normal Lower (legs, knees, ankles, toes): None, normal, Trunk Movements Neck, shoulders, hips: None, normal, Overall Severity Severity of abnormal movements (highest score from questions above): None, normal Incapacitation due to abnormal movements: None, normal Patient's awareness of abnormal movements (rate only patient's report): No Awareness, Dental Status Current problems with teeth and/or dentures?: No Does patient usually wear dentures?: No  CIWA:  CIWA-Ar Total: 0 COWS:     Musculoskeletal: Strength & Muscle Tone: within normal limits Gait & Station: normal  Patient leans: N/A  Psychiatric Specialty Exam: Physical Exam  Nursing note and vitals reviewed. Constitutional: He is oriented to person, place, and time. He appears well-developed.  Eyes: Pupils are equal, round, and reactive to light.  Cardiovascular: Normal rate.  Respiratory: Effort normal.  Genitourinary:    Genitourinary Comments: Deferred   Musculoskeletal:        General: Normal range of motion.     Cervical back: Normal range of motion.  Neurological: He is  alert and oriented to person, place, and time.  Skin: Skin is warm and dry.    Review of Systems  Constitutional: Negative.   HENT: Negative.   Respiratory: Negative.   Cardiovascular: Negative.   Gastrointestinal: Negative.   Genitourinary: Negative for difficulty urinating.  Musculoskeletal: Negative.   Allergic/Immunologic: Negative for environmental allergies and food allergies.       Allergies: NKDA  Neurological: Negative.   Psychiatric/Behavioral: Positive for dysphoric mood ("Improving"). Negative for agitation, behavioral problems, confusion, hallucinations, self-injury, sleep disturbance and suicidal ideas. The patient is not nervous/anxious and is not hyperactive.     Blood pressure 112/71, pulse 78, temperature 97.6 F (36.4 C), temperature source Oral, resp. rate 16, height 6' 1.25" (1.861 m), weight 91.6 kg, SpO2 100 %.Body mass index is 26.47 kg/m.  General Appearance: Fairly Groomed  Eye Contact:  Fair  Speech:  Clear and Coherent and Normal Rate  Volume:  Normal  Mood:  "I'm doing okay, not great, not bad but right in the middle"  Affect:  Appropriate and reactive.  Thought Process:  Coherent, Linear and Descriptions of Associations: Intact  Orientation:  Full (Time, Place, and Person)  Thought Content:  Logical, denies any hallucinations, delusions or paranoia.  Suicidal Thoughts:  Currently denies any thoughts, plans or intent.  Homicidal Thoughts:  Denies  Memory:  Immediate;   Fair Recent;   Fair  Judgement:  Intact  Insight:  Fair  Psychomotor Activity:  Normal  Concentration:  Concentration: Fair and Attention Span: Fair  Recall:  FiservFair  Fund of Knowledge:  Fair  Language:  Fair  Akathisia:  No  Handed:  Right  AIMS (if indicated):     Assets:  Communication Skills Desire for Improvement Financial Resources/Insurance Physical Health  ADL's:  Intact  Cognition:  WNL  Sleep:  Number of Hours: 6.75   Treatment Plan Summary: Daily contact with  patient to assess and evaluate symptoms and progress in treatment and Medication management   - Continue inpatient hospitalization. - Will continue today 11/13/2019 plan as below except where it is noted.  Depression.    - Continue sertraline 50 mg po daily.  Alcohol;ism.    - Continue Campral 666 mg po tid with meals.  Anxiety/agitation.    - Continue gabapentin 600 mg po Q bedtime.    - Continue Vistaril 25 mg po tid prn.    - Continue Ativan 1 mg po Q 6 hrs prn for CIWA > 10.  Insomnia.    - Continue Trazodone 50 mg po Q hs prn.  Other medical issues.    - finasteride 1 mg po Q hs for alopecia.    - Continue Synthroid 100 mcg po Q am for hypothyroidism.    - pyridoxine 100 mg po daily for Vit. B-6 replacement.    - Continue thiamine 100 mg po daily for thiamine deficiency.    - Continue Cyanocobalamin 1,000 mcg po daily for B-12 replacement.  Encourage group participation. Discharge disposition in progress.  Armandina StammerAgnes Kashay Cavenaugh,  NP, PMHNP, FNP-BC. 11/13/2019, 10:28 AMPatient ID: Kenney Houseman, male   DOB: [birth year], 34 y.o.   MRN: [id number] Patient ID: CHANDLAR GUICE, male   DOB: 1985/11/16, 34 y.o.   MRN: [id number]

## 2019-11-13 NOTE — Progress Notes (Signed)
D:  Patient's self inventory sheet, patient has fair sleep, no sleep medication.  Fair appetite, normal energy level, good concentration.  Rated depression and anxiety 5, hopeless 7.  Withdrawals denied.  Denied SI.  Denied physical problems.  Physical pain, R leg, stress fracture, worst pain #2, pain medicine helpful.  Goal is positive outlook.  Plans positive thinking, focus on strong mind and body.  "I appreciate all staff in the unit."  Does have discharge plans.  Plans to work on discharge plan tomorrow. A:   Medications administered per MD orders.  Emotional support and encouragement given patient. R:  Denied SI and HI, contracts for safety.  Denied A/V hallucinations.  Safety maintained with 15 minute checks.

## 2019-11-13 NOTE — Progress Notes (Signed)
   11/13/19 2200  Psych Admission Type (Psych Patients Only)  Admission Status Voluntary  Psychosocial Assessment  Patient Complaints Anxiety;Depression  Eye Contact Fair  Facial Expression Anxious  Affect Appropriate to circumstance  Speech Logical/coherent  Interaction Assertive  Motor Activity Other (Comment) (WDL)  Appearance/Hygiene In scrubs;Disheveled  Behavior Characteristics Anxious  Mood Depressed  Thought Process  Coherency WDL  Content WDL  Delusions None reported or observed  Perception WDL  Hallucination None reported or observed  Judgment Poor  Confusion None  Danger to Self  Current suicidal ideation? Denies  Danger to Others  Danger to Others None reported or observed

## 2019-11-14 MED ORDER — THIAMINE HCL 100 MG PO TABS: 100.0000 mg | ORAL_TABLET | Freq: Every day | ORAL | 0 refills | Status: DC

## 2019-11-14 MED ORDER — TRAZODONE HCL 50 MG PO TABS: 50.0000 mg | ORAL_TABLET | Freq: Every evening | ORAL | 0 refills | Status: DC | PRN

## 2019-11-14 MED ORDER — SERTRALINE HCL 50 MG PO TABS: 50.0000 mg | ORAL_TABLET | Freq: Every day | ORAL | 0 refills | Status: DC

## 2019-11-14 MED ORDER — HYDROXYZINE HCL 25 MG PO TABS: 25.0000 mg | ORAL_TABLET | Freq: Three times a day (TID) | ORAL | 0 refills | Status: DC | PRN

## 2019-11-14 MED ORDER — GABAPENTIN 300 MG PO CAPS: 600.0000 mg | ORAL_CAPSULE | Freq: Every day | ORAL | 0 refills | Status: DC

## 2019-11-14 MED ORDER — ACAMPROSATE CALCIUM 333 MG PO TBEC: 666.0000 mg | DELAYED_RELEASE_TABLET | Freq: Three times a day (TID) | ORAL | 0 refills | Status: DC

## 2019-11-14 NOTE — Progress Notes (Signed)
D: Pt A & O X 4. Denies SI, HI, AVH and pain at this time. D/C home as ordered. Picked up in lobby by his father.  A: D/C instructions reviewed with pt including prescriptions and follow up appointments; compliance encouraged. All belongings from locker 40 returned to pt at time of departure. Scheduled medications administered with verbal education and effects monitored. Safety checks maintained without incident till time of d/c.  R: Pt receptive to care. Compliant with medications when offered. Denies adverse drug reactions when assessed. Verbalized understanding related to d/c instructions. Signed belonging sheet in agreement with items received from locker. Ambulatory with a steady gait. Appears to be in no physical distress at time of departure.

## 2019-11-14 NOTE — Progress Notes (Signed)
  [facility] Adult Case Management Discharge Plan :  Will you be returning to the same living situation after discharge:  Yes,  patient is returning home with his parents At discharge, do you have transportation home?: Yes,  patient's parents are picking him up Do you have the ability to pay for your medications: Yes,  BCBS  Release of information consent forms completed and in the chart;  Patient's signature needed at discharge.  Patient to Follow up at: Follow-up Information    [facility]. Go on 11/26/2019.   Specialty: Behavioral Health Why: You are scheduled for an appointment on 11/26/19 at 11:30 am with Corie Chiquito for medication management. This appointment will be held in person. Be sure to provide any discharge paperwork from this hospitalization, including your list of medications.  Contact information: [address] [place] [address] [phone]        [facility]. Go on 11/14/2019.   Specialty: Behavioral Health Why: You have an appointment on 11/14/19 at 1:00 pm for IOP services. This appointment will be held in person.  Please bring your insurance information and your discharge summary with you.  Contact information: [address] [facility] [place] [address] [phone]       [facility]. Go on 11/18/2019.   Why: You have an appointment on Tuesday, 11/18/2019. Please call provider to determine appointment time once you have discharged. Please be sure to provide any discharge paperwork from this hospitalization.  Contact information: [address] [place], [place] [address]  Phone:[phone] Fax: [phone]          Next level of care provider has access to [facility] Link:yes  Safety Planning and Suicide Prevention discussed: Yes,  with the patient's parents   Have you used any form of tobacco in the last 30 days? (Cigarettes, Smokeless Tobacco, Cigars, and/or Pipes):  No  Has patient been referred to the Quitline?: N/A patient is not a smoker  Patient has been referred for addiction treatment: N/A  Maeola Sarah, LCSWA 11/14/2019, 8:49 AM

## 2019-11-14 NOTE — BHH Suicide Risk Assessment (Signed)
[facility] Discharge Suicide Risk Assessment   Principal Problem: Severe recurrent major depression without psychotic features [facility]) Discharge Diagnoses: Principal Problem:   Severe recurrent major depression without psychotic features (HCC)   Total Time spent with patient: 15 minutes  Musculoskeletal: Strength & Muscle Tone: within normal limits Gait & Station: normal Patient leans: N/A  Psychiatric Specialty Exam: Review of Systems  All other systems reviewed and are negative.   Blood pressure (!) 103/56, pulse 80, temperature 98.1 F (36.7 C), temperature source Oral, resp. rate 18, height 6' 1.25" (1.861 m), weight 91.6 kg, SpO2 100 %.Body mass index is 26.47 kg/m.  General Appearance: Casual  Eye Contact::  Good  Speech:  Normal Rate409  Volume:  Normal  Mood:  Euthymic  Affect:  Congruent  Thought Process:  Coherent and Descriptions of Associations: Intact  Orientation:  Full (Time, Place, and Person)  Thought Content:  Logical  Suicidal Thoughts:  No  Homicidal Thoughts:  No  Memory:  Immediate;   Good Recent;   Good Remote;   Good  Judgement:  Intact  Insight:  Good  Psychomotor Activity:  Normal  Concentration:  Good  Recall:  Good  Fund of Knowledge:Good  Language: Good  Akathisia:  Negative  Handed:  Right  AIMS (if indicated):     Assets:  Communication Skills Desire for Improvement Resilience  Sleep:  Number of Hours: 6.75  Cognition: WNL  ADL's:  Intact   Mental Status Per Nursing Assessment::   On Admission:  Suicidal ideation indicated by patient, Suicidal ideation indicated by others, Intention to act on plan to harm others, Self-harm thoughts  Demographic Factors:  Male and Caucasian  Loss Factors: Financial problems/change in socioeconomic status  Historical Factors: Impulsivity  Risk Reduction Factors:   Positive social support and Positive coping skills or problem solving skills  Continued Clinical Symptoms:  Depression:   Comorbid  alcohol abuse/dependence Impulsivity Alcohol/Substance Abuse/Dependencies  Cognitive Features That Contribute To Risk:  None    Suicide Risk:  Minimal: No identifiable suicidal ideation.  Patients presenting with no risk factors but with morbid ruminations; may be classified as minimal risk based on the severity of the depressive symptoms  Follow-up Information    [facility]. Go on 11/26/2019.   Specialty: Behavioral Health Why: You are scheduled for an appointment on 11/26/19 at 11:30 am with Corie Chiquito for medication management. This appointment will be held in person. Be sure to provide any discharge paperwork from this hospitalization, including your list of medications.  Contact information: [address] [place] [address] [phone]        [facility]. Go on 11/14/2019.   Specialty: Behavioral Health Why: You have an appointment on 11/14/19 at 1:00 pm for IOP services. This appointment will be held in person.  Please bring your insurance information and your discharge summary with you.  Contact information: [address] [facility] [place] [address] [phone]       [facility]. Go on 11/18/2019.   Why: You have an appointment on Tuesday, 11/18/2019. Please call provider to determine appointment time once you have discharged. Please be sure to provide any discharge paperwork from this hospitalization.  Contact information: [address] [place], [place] [address]  [phone] Fax: [phone]          Plan Of Care/Follow-up recommendations:  Activity:  ad lib  Antonieta Pert, MD 11/14/2019, 9:19 AM

## 2019-11-14 NOTE — Discharge Summary (Signed)
Physician Discharge Summary Note  Patient:  Patrick NickJohn M Tatum is an 34 y.o., male  MRN:  [id number]  DOB:  12/05/1985  Patient phone:  [phone] (home)   Patient address:   [address] [place] [id number],   Total Time spent with patient: Greater than 30 minutes  Date of Admission:  11/09/2019  Date of Discharge: 11-14-19  Reason for Admission:Worsening suicidal ideations with plans to overdose on pills.  Principal Problem: Severe recurrent major depression without psychotic features [facility](HCC)  Discharge Diagnoses: Principal Problem:   Severe recurrent major depression without psychotic features [facility](HCC)  Past Psychiatric History: Severe Major depressive disorder.  Past Medical History:  Past Medical History:  Diagnosis Date  . Acne   . Allergic rhinitis   . Compartment syndrome (HCC) 2010   right leg  . Depression   . Hypothyroidism   . Sever's disease     Past Surgical History:  Procedure Laterality Date  . compartment syndrome release  2010   RLE  . ED visit   2011    for Alcohol related problem  . GROWTH PLATE SURGERY  ~ 2000   right foot  . LUMBAR LAMINECTOMY/DECOMPRESSION MICRODISCECTOMY  05/02/2012   Procedure: LUMBAR LAMINECTOMY/DECOMPRESSION MICRODISCECTOMY;  Surgeon: Mat CarneSydney Michael Tooke, MD;  [facility];  Service: Orthopedics;  Laterality: Right;  lumbar five-sacral one laminotomy and disc excision right side  . nose fracture    . RHINOPLASTY  1999  . TONSILLECTOMY AND ADENOIDECTOMY  2003   Family History:  Family History  Problem Relation Age of Onset  . Prostate cancer Father [id number]  . Autism Brother   . Diabetes Other   . Allergies Other   . Alcohol abuse Other        mom's side of the family  . Alcohol abuse Maternal Grandfather    Family Psychiatric  History: See H&P  Social History:  Social History   Substance and Sexual Activity  Alcohol Use Yes  . Alcohol/week: 10.0 standard drinks  . Types: 10 Shots of liquor per week    Comment: Drinks a 5th daily     Social History   Substance and Sexual Activity  Drug Use No    Social History   Socioeconomic History  . Marital status: Single    Spouse name: Not on file  . Number of children: Not on file  . Years of education: Not on file  . Highest education level: Not on file  Occupational History  . Not on file  Tobacco Use  . Smoking status: Former Smoker    Types: Cigarettes    Quit date: 09/26/2019    Years since quitting: 0.1  . Smokeless tobacco: Former NeurosurgeonUser    Types: Snuff  . Tobacco comment: 04/30/2012 "smoked & used snuff maybe for 1 wk total"  Substance and Sexual Activity  . Alcohol use: Yes    Alcohol/week: 10.0 standard drinks    Types: 10 Shots of liquor per week    Comment: Drinks a 5th daily  . Drug use: No  . Sexual activity: Yes    Birth control/protection: None  Other Topics Concern  . 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  Difficulty of Paying Living Expenses:   Food Insecurity:   . Worried About Charity fundraiser in the Last Year:   . Arboriculturist in the Last Year:   Transportation Needs:   . Film/video editor (Medical):   Marland Kitchen Lack of Transportation (Non-Medical):   Physical Activity:   . Days of Exercise per Week:   . Minutes of Exercise per Session:   Stress:   . Feeling of Stress :   Social Connections:   . Frequency of Communication with Friends and Family:   . Frequency of Social Gatherings with Friends and Family:   . Attends Religious Services:   . Active Member of Clubs or Organizations:   . Attends Archivist Meetings:   Marland Kitchen Marital Status:    Hospital Course: (Per Md's admissions evaluation): 34 year old male, presented to hospital voluntarily due to suicidal  ideations , with thoughts of overdosing or stabbing self, which he reports started yesterday following relapse on alcohol. States he told his parents and was brought to ED . He reports he has been feeling depressed over the last several weeks following losing his job of 7 year in April 2021. States that at the time was also drinking daily, heavily. He reports that he stopped drinking mid April ( at which time he developed brief auditory /visual hallucinations felt to be related to [facility], which lasted about a day). He remained sober until day prior to admission when he relapsed on alcohol , drank heavily. He states that in the context of intoxication he sent some inappropriate pictures of a prior coworker to others and is now concerned about possible legal repercussions. Endorses neuro-vegetative symptoms as below 5/16 BAL negative. UDS negative.  This is the first psychiatric admission/discharge summary in this [facility] for this 34 year old Caucasian male with hx of alcoholism. He was brought to the hospital ED for crisis management for development of suicidal ideations with plan to overdose on medications or stab himself. Apparently per admission reports, Yahir has been dealing with some issues that include, losing his job of 7 years due to heavy alcohol drinking that led to auditory hallucinations triggered by alcohol withdrawal symptoms. He was also dealing with the concerns he has about a threat of legal actions may be against him for sending to co-worker inappropriate pictures of another co-worker. He was brought to the hospital for evaluation & mood stabilization treatments.    After evaluation of his presenting symptoms, Keifer was recommended for mood stabilization treatments. The medication regimen for his presenting symptoms were discussed & with his consent initiated. He received, stabilized & was discharged on the medications as listed below on his discharge medication lists. He was also enrolled &  participated in the group counseling sessions being offered & held on this unit. He learned coping skills. He presented on this admission, other pre-existing medical condition that required treatment. He was resumed & discharged on his pertinent home medications for those health issues.  He tolerated his treatment regimen without any adverse effects or reactions reported.   During the course of his hospitalization, the 15-minute checks were adequate to ensure Satish's safety. Patient did not display any dangerous, violent or suicidal behavior on the unit.  He interacted with the other patients & staff appropriately, although kept to himself quite much. He participated appropriately in the group sessions/therapies. His medications were addressed & adjusted to meet his needs. He was recommended for outpatient follow-up care & medication management  upon discharge to assure his continuity of care. At the time of discharge patient is not reporting any acute suicidal/homicidal ideations. He feels more confident about his self & mental health care. He currently denies any new issues or concerns. Education & supportive counseling provided throughout her hospital stay & upon discharge.   Today upon his discharge evaluation with the attending psychiatrist, Marcelo shares he is doing well. He denies any other specific concerns. He is sleeping well. His appetite is good. He denies other physical complaints. He denies AH/VH. He feels that his medications have been helpful & is in agreement to continue his current treatment regimen as recommended. He was able to engage in safety planning including plan to return to [facility] or contact emergency services if he feels unable to maintain his own safety or the safety of others. Pt had no further questions, comments, or concerns. He left [facility] with all personal belongings in no apparent distress. Transportation per his family (parents).  Physical Findings: AIMS: Facial and Oral  Movements Muscles of Facial Expression: None, normal Lips and Perioral Area: None, normal Jaw: None, normal Tongue: None, normal,Extremity Movements Upper (arms, wrists, hands, fingers): None, normal Lower (legs, knees, ankles, toes): None, normal, Trunk Movements Neck, shoulders, hips: None, normal, Overall Severity Severity of abnormal movements (highest score from questions above): None, normal Incapacitation due to abnormal movements: None, normal Patient's awareness of abnormal movements (rate only patient's report): No Awareness, Dental Status Current problems with teeth and/or dentures?: No Does patient usually wear dentures?: No  CIWA:  CIWA-Ar Total: 1 COWS:     Musculoskeletal: Strength & Muscle Tone: within normal limits Gait & Station: normal Patient leans: N/A  Psychiatric Specialty Exam: Physical Exam  Nursing note and vitals reviewed. Constitutional: He is oriented to person, place, and time. He appears well-developed.  HENT:  Head: Normocephalic.  Eyes: Pupils are equal, round, and reactive to light.  Cardiovascular: Normal rate.  Respiratory: Effort normal.  Genitourinary:    Genitourinary Comments: Deferred   Musculoskeletal:        General: Normal range of motion.     Cervical back: Normal range of motion.  Neurological: He is alert and oriented to person, place, and time.  Skin: Skin is warm and dry.    Review of Systems  Constitutional: Negative for chills, diaphoresis and fever.  HENT: Negative for congestion, rhinorrhea, sneezing and sore throat.   Eyes: Negative for discharge.  Respiratory: Negative for cough, chest tightness, shortness of breath and wheezing.   Cardiovascular: Negative for chest pain and palpitations.  Gastrointestinal: Negative for diarrhea, nausea and vomiting.  Endocrine: Negative for cold intolerance.  Genitourinary: Negative for difficulty urinating.  Musculoskeletal: Negative.   Skin: Negative.   Allergic/Immunologic:  Negative for environmental allergies and food allergies.       Allergies: NKDA  Neurological: Negative for dizziness, tremors, seizures, syncope, weakness, light-headedness, numbness and headaches.  Psychiatric/Behavioral: Positive for dysphoric mood (Stabilized with medication prior to discharge) and sleep disturbance (Stabilized with medication prior to discharge). Negative for agitation, behavioral problems, confusion, decreased concentration, hallucinations, self-injury and suicidal ideas. The patient is not nervous/anxious (Stable) and is not hyperactive.     Blood pressure (!) 103/56, pulse 80, temperature 98.1 F (36.7 C), temperature source Oral, resp. rate 18, height 6' 1.25" (1.861 m), weight 91.6 kg, SpO2 100 %.Body mass index is 26.47 kg/m.  See Md's discharge SRA  Sleep:  Number of Hours: 6.75   Have you used any form of tobacco in  the last 30 days? (Cigarettes, Smokeless Tobacco, Cigars, and/or Pipes): No  Has this patient used any form of tobacco in the last 30 days? (Cigarettes, Smokeless Tobacco, Cigars, and/or Pipes): N/A  Blood Alcohol level:  Lab Results  Component Value Date   ETH <10 11/09/2019   Metabolic Disorder Labs:  Lab Results  Component Value Date   HGBA1C 5.0 11/10/2019   MPG 97 11/10/2019   No results found for: PROLACTIN Lab Results  Component Value Date   CHOL 147 11/10/2019   TRIG 99 11/10/2019   HDL 32 (L) 11/10/2019   CHOLHDL 4.6 11/10/2019   VLDL 20 11/10/2019   LDLCALC 95 11/10/2019   See Psychiatric Specialty Exam and Suicide Risk Assessment completed by Attending Physician prior to discharge.  Discharge destination:  Home  Is patient on multiple antipsychotic therapies at discharge:  No   Has Patient had three or more failed trials of antipsychotic monotherapy by history:  No  Recommended Plan for Multiple Antipsychotic Therapies: NA  Allergies as of 11/14/2019   No Known Allergies     Medication List    STOP taking these  medications   fexofenadine 180 MG tablet Commonly known as: ALLEGRA   ibuprofen 200 MG tablet Commonly known as: ADVIL   melatonin 5 MG Tabs     TAKE these medications     Indication  acamprosate 333 MG tablet Commonly known as: CAMPRAL Take 2 tablets (666 mg total) by mouth 3 (three) times daily with meals. For alcoholism  Indication: Abuse or Misuse of Alcohol   finasteride 1 MG tablet Commonly known as: PROPECIA Take 1 mg by mouth at bedtime.  Indication: Male Pattern Baldness   gabapentin 300 MG capsule Commonly known as: NEURONTIN Take 2 capsules (600 mg total) by mouth at bedtime. For agitation What changed: additional instructions  Indication: Abuse or Misuse of Alcohol, Agitation   hydrOXYzine 25 MG tablet Commonly known as: ATARAX/VISTARIL Take 1 tablet (25 mg total) by mouth 3 (three) times daily as needed for anxiety.  Indication: Feeling Anxious   multivitamin with minerals Tabs tablet Take 1 tablet by mouth daily.  Indication: Vitamin supplementation   pyridOXINE 100 MG tablet Commonly known as: VITAMIN B-6 Take 100 mg by mouth daily. [place]  Indication: Lack of Vitamin B6   sertraline 50 MG tablet Commonly known as: ZOLOFT Take 1 tablet (50 mg total) by mouth daily. For depression Start taking on: Nov 15, 2019  Indication: Major Depressive Disorder   Synthroid 100 MCG tablet Generic drug: levothyroxine Take 1 tablet (100 mcg total) by mouth daily.  Indication: Underactive Thyroid   thiamine 100 MG tablet Take 1 tablet (100 mg total) by mouth daily. For thiamine replacement Start taking on: Nov 15, 2019 What changed: additional instructions  Indication: Deficiency of Vitamin B1   traZODone 50 MG tablet Commonly known as: DESYREL Take 1 tablet (50 mg total) by mouth at bedtime as needed for sleep.  Indication: Trouble Sleeping   vitamin B-12 1000 MCG tablet Commonly known as: CYANOCOBALAMIN Take 1,000 mcg by mouth daily.   Indication: Inadequate Vitamin B12      Follow-up Information    [facility]. Go on 11/26/2019.   Specialty: Behavioral Health Why: You are scheduled for an appointment on 11/26/19 at 11:30 am with Corie Chiquito for medication management. This appointment will be held in person. Be sure to provide any discharge paperwork from this hospitalization, including your list of medications.  Contact information: [address] [place] [address] [phone]        [facility]. Go on 11/14/2019.   Specialty: Behavioral Health Why: You have an appointment on 11/14/19 at 1:00 pm for IOP services. This appointment will be held in person.  Please bring your insurance information and your discharge summary with you.  Contact information: [address] [facility] [place] [address] [phone]       [facility]. Go on 11/18/2019.   Why: You have an appointment on Tuesday, 11/18/2019. Please call Lisa-Marie Rueger to determine appointment time once you have discharged. Please be sure to provide any discharge paperwork from this hospitalization.  Contact information: [address] [place], [place] [address]  Phone:[phone] Fax: [phone]         Follow-up recommendations: Activity:  As tolerated Diet: As recommended by your primary care doctor. Keep all scheduled follow-up appointments as recommended.  Comments: Prescriptions given at discharge.  Patient agreeable to plan.  Given opportunity to ask questions.  Appears to feel comfortable with discharge denies any current suicidal or homicidal thought. Patient is also instructed prior to discharge to: Take all medications as prescribed by his/her mental healthcare Emiyah Spraggins. Report any adverse effects and or reactions from the medicines to his/her outpatient Corianne Buccellato promptly. Patient has been instructed & cautioned: To not engage in alcohol and or illegal drug use  while on prescription medicines. In the event of worsening symptoms, patient is instructed to call the crisis hotline, 911 and or go to the nearest ED for appropriate evaluation and treatment of symptoms. To follow-up with his/her primary care Carleigh Buccieri for your other medical issues, concerns and or health care needs.  Signed: Armandina Stammer, NP, PMHNP, FNP-BC 11/14/2019, 9:24 AM

## 2019-11-14 NOTE — Tx Team (Signed)
Interdisciplinary Treatment and Diagnostic Plan Update  11/14/2019 Time of Session: 9:00am Patrick Hamilton MRN: [id number]  Principal Diagnosis: Severe recurrent major depression without psychotic features [facility])  Secondary Diagnoses: Principal Problem:   Severe recurrent major depression without psychotic features (HCC)   Current Medications:  Current Facility-Administered Medications  Medication Dose Route Frequency Provider Last Rate Last Admin  . acamprosate (CAMPRAL) tablet 666 mg  666 mg Oral TID WC Hamilton, Patrick Situ, MD   666 mg at 11/14/19 1607  . acetaminophen (TYLENOL) tablet 650 mg  650 mg Oral Q6H PRN Patrick Conn A, NP   650 mg at 11/12/19 1810  . alum & mag hydroxide-simeth (MAALOX/MYLANTA) 200-200-20 MG/5ML suspension 30 mL  30 mL Oral Q4H PRN Patrick Conn A, NP      . finasteride (PROPECIA) tablet 1 mg  1 mg Oral QHS Hamilton, Patrick I, NP      . gabapentin (NEURONTIN) capsule 600 mg  600 mg Oral QHS Patrick Conn A, NP   600 mg at 11/13/19 2116  . hydrOXYzine (ATARAX/VISTARIL) tablet 25 mg  25 mg Oral TID PRN Patrick Poling, NP   25 mg at 11/13/19 2117  . levothyroxine (SYNTHROID) tablet 100 mcg  100 mcg Oral Daily Patrick Conn A, NP   100 mcg at 11/14/19 3710  . magnesium hydroxide (MILK OF MAGNESIA) suspension 30 mL  30 mL Oral Daily PRN Patrick Conn A, NP      . multivitamin with minerals tablet 1 tablet  1 tablet Oral Daily Patrick Conn A, NP   1 tablet at 11/14/19 0815  . pneumococcal 23 valent vaccine (PNEUMOVAX-23) injection 0.5 mL  0.5 mL Intramuscular Tomorrow-1000 Hamilton, Patrick A, MD      . pyridOXINE (VITAMIN B-6) tablet 100 mg  100 mg Oral Daily Patrick Conn A, NP   100 mg at 11/14/19 0815  . sertraline (ZOLOFT) tablet 50 mg  50 mg Oral Daily Hamilton, Patrick Situ, MD   50 mg at 11/14/19 0815  . thiamine (B-1) injection 100 mg  100 mg Intramuscular Once Patrick Conn A, NP      . thiamine tablet 100 mg  100 mg Oral Daily Patrick Conn A, NP   100 mg at 11/14/19 0815  .  traZODone (DESYREL) tablet 50 mg  50 mg Oral QHS PRN Patrick Conn A, NP   50 mg at 11/13/19 2116  . vitamin B-12 (CYANOCOBALAMIN) tablet 1,000 mcg  1,000 mcg Oral Daily Patrick Conn A, NP   1,000 mcg at 11/14/19 0815   PTA Medications: Medications Prior to Admission  Medication Sig Dispense Refill Last Dose  . fexofenadine (ALLEGRA) 180 MG tablet Take 180 mg by mouth daily.     . finasteride (PROPECIA) 1 MG tablet Take 1 mg by mouth at bedtime.      Marland Kitchen ibuprofen (ADVIL,MOTRIN) 200 MG tablet Take 600 mg by mouth every 6 (six) hours as needed for headache, mild pain or moderate pain. For pain      . melatonin 5 MG TABS Take 10 mg by mouth at bedtime.      . Multiple Vitamin (MULTIVITAMIN WITH MINERALS) TABS Take 1 tablet by mouth daily.     Marland Kitchen pyridOXINE (VITAMIN B-6) 100 MG tablet Take 100 mg by mouth daily. Patrick Hamilton     . SYNTHROID 100 MCG tablet Take 1 tablet (100 mcg total) by mouth daily. 90 tablet 1   . thiamine 100 MG tablet Take 100 mg by mouth daily. Vitamin B1  Nature [employer]     . vitamin B-12 (CYANOCOBALAMIN) 1000 MCG tablet Take 1,000 mcg by mouth daily.      . [DISCONTINUED] gabapentin (NEURONTIN) 300 MG capsule Take 2 capsules (600 mg total) by mouth at bedtime. 60 capsule 3     Patient Stressors: Paediatric nurse issue Marital or family conflict Occupational concerns Substance abuse Traumatic event  Patient Strengths: Average or above average intelligence Communication skills Supportive family/friends  Treatment Modalities: Medication Management, Group therapy, Case management,  1 to 1 session with clinician, Psychoeducation, Recreational therapy.   Physician Treatment Plan for Primary Diagnosis: Severe recurrent major depression without psychotic features (HCC) Long Term Goal(s): Improvement in symptoms so as ready for discharge Improvement in symptoms so as ready for discharge   Short Term Goals: Ability to identify changes in lifestyle to reduce  recurrence of condition will improve Ability to verbalize feelings will improve Ability to disclose and discuss suicidal ideas Ability to demonstrate self-control will improve Ability to identify and develop effective coping behaviors will improve Ability to maintain clinical measurements within normal limits will improve Compliance with prescribed medications will improve Ability to identify changes in lifestyle to reduce recurrence of condition will improve Ability to identify triggers associated with substance abuse/mental health issues will improve  Medication Management: Evaluate patient's response, side effects, and tolerance of medication regimen.  Therapeutic Interventions: 1 to 1 sessions, Unit Group sessions and Medication administration.  Evaluation of Outcomes: Adequate for Discharge  Physician Treatment Plan for Secondary Diagnosis: Principal Problem:   Severe recurrent major depression without psychotic features (HCC)  Long Term Goal(s): Improvement in symptoms so as ready for discharge Improvement in symptoms so as ready for discharge   Short Term Goals: Ability to identify changes in lifestyle to reduce recurrence of condition will improve Ability to verbalize feelings will improve Ability to disclose and discuss suicidal ideas Ability to demonstrate self-control will improve Ability to identify and develop effective coping behaviors will improve Ability to maintain clinical measurements within normal limits will improve Compliance with prescribed medications will improve Ability to identify changes in lifestyle to reduce recurrence of condition will improve Ability to identify triggers associated with substance abuse/mental health issues will improve     Medication Management: Evaluate patient's response, side effects, and tolerance of medication regimen.  Therapeutic Interventions: 1 to 1 sessions, Unit Group sessions and Medication administration.  Evaluation of  Outcomes: Adequate for Discharge   RN Treatment Plan for Primary Diagnosis: Severe recurrent major depression without psychotic features (HCC) Long Term Goal(s): Knowledge of disease and therapeutic regimen to maintain health will improve  Short Term Goals: Ability to participate in decision making will improve, Ability to verbalize feelings will improve and Ability to disclose and discuss suicidal ideas  Medication Management: RN will administer medications as ordered by provider, will assess and evaluate patient's response and provide education to patient for prescribed medication. RN will report any adverse and/or side effects to prescribing provider.  Therapeutic Interventions: 1 on 1 counseling sessions, Psychoeducation, Medication administration, Evaluate responses to treatment, Monitor vital signs and CBGs as ordered, Perform/monitor CIWA, COWS, AIMS and Fall Risk screenings as ordered, Perform wound care treatments as ordered.  Evaluation of Outcomes: Adequate for Discharge   LCSW Treatment Plan for Primary Diagnosis: Severe recurrent major depression without psychotic features (HCC) Long Term Goal(s): Safe transition to appropriate next level of care at discharge, Engage patient in therapeutic group addressing interpersonal concerns.  Short Term Goals: Engage patient in aftercare planning with  referrals and resources and Increase social support  Therapeutic Interventions: Assess for all discharge needs, 1 to 1 time with Social worker, Explore available resources and support systems, Assess for adequacy in community support network, Educate family and significant other(s) on suicide prevention, Complete Psychosocial Assessment, Interpersonal group therapy.  Evaluation of Outcomes: Adequate for Discharge   Progress in Treatment: Attending groups: Yes. Participating in groups: Yes. Taking medication as prescribed: Yes. Toleration medication: Yes. Family/Significant other contact  made: Yes, individual(s) contacted:  the patient's parents Patient understands diagnosis: Yes. Discussing patient identified problems/goals with staff: Yes. Medical problems stabilized or resolved: Yes. Denies suicidal/homicidal ideation: Yes. Issues/concerns per patient self-inventory: No. Other:   New problem(s) identified: None   New Short Term/Long Term Goal(s): Detox, medication stabilization, elimination of SI thoughts, development of comprehensive mental wellness plan.    Patient Goals:  "I'm an alcoholic and I want to beat that. I want to destruct my self harming behaviors"  Discharge Plan or Barriers: Starts IOP through [facility] today.   Reason for Continuation of Hospitalization: Anxiety  Estimated Length of Stay: discharging today  Attendees: Patient: Patrick Hamilton  11/14/2019 9:24 AM  Physician:  11/14/2019 9:24 AM  Nursing:  11/14/2019 9:24 AM  RN Care Manager: 11/14/2019 9:24 AM  Social Worker: Radonna Ricker, LCSW 11/14/2019 9:24 AM  Recreational Therapist:  11/14/2019 9:24 AM  Other: Harriett Sine, NP 11/14/2019 9:24 AM  Other:  11/14/2019 9:24 AM  Other: 11/14/2019 9:24 AM    Scribe for Treatment Team: Joellen Jersey, [place] 11/14/2019 9:24 AM

## 2019-11-17 DIAGNOSIS — F332 Major depressive disorder, recurrent severe without psychotic features: Secondary | ICD-10-CM | POA: Diagnosis not present

## 2019-11-18 DIAGNOSIS — F332 Major depressive disorder, recurrent severe without psychotic features: Secondary | ICD-10-CM | POA: Diagnosis not present

## 2019-11-19 DIAGNOSIS — F332 Major depressive disorder, recurrent severe without psychotic features: Secondary | ICD-10-CM | POA: Diagnosis not present

## 2019-11-20 DIAGNOSIS — F332 Major depressive disorder, recurrent severe without psychotic features: Secondary | ICD-10-CM | POA: Diagnosis not present

## 2019-11-21 ENCOUNTER — Other Ambulatory Visit: Payer: Self-pay

## 2019-11-21 DIAGNOSIS — F332 Major depressive disorder, recurrent severe without psychotic features: Secondary | ICD-10-CM | POA: Diagnosis not present

## 2019-11-24 DIAGNOSIS — F332 Major depressive disorder, recurrent severe without psychotic features: Secondary | ICD-10-CM | POA: Diagnosis not present

## 2019-11-25 ENCOUNTER — Other Ambulatory Visit: Payer: Self-pay

## 2019-11-25 ENCOUNTER — Other Ambulatory Visit (INDEPENDENT_AMBULATORY_CARE_PROVIDER_SITE_OTHER): Payer: BC Managed Care – PPO

## 2019-11-25 DIAGNOSIS — F10988 Alcohol use, unspecified with other alcohol-induced disorder: Secondary | ICD-10-CM | POA: Diagnosis not present

## 2019-11-25 DIAGNOSIS — Z1159 Encounter for screening for other viral diseases: Secondary | ICD-10-CM

## 2019-11-25 DIAGNOSIS — R7989 Other specified abnormal findings of blood chemistry: Secondary | ICD-10-CM

## 2019-11-25 DIAGNOSIS — R748 Abnormal levels of other serum enzymes: Secondary | ICD-10-CM

## 2019-11-25 DIAGNOSIS — F331 Major depressive disorder, recurrent, moderate: Secondary | ICD-10-CM | POA: Diagnosis not present

## 2019-11-25 LAB — TSH: TSH: 2.05 u[IU]/mL (ref 0.35–4.50)

## 2019-11-25 LAB — HEPATIC FUNCTION PANEL
ALT: 17 U/L (ref 0–53)
AST: 29 U/L (ref 0–37)
Albumin: 4.3 g/dL (ref 3.5–5.2)
Alkaline Phosphatase: 49 U/L (ref 39–117)
Bilirubin, Direct: 0.1 mg/dL (ref 0.0–0.3)
Total Bilirubin: 0.4 mg/dL (ref 0.2–1.2)
Total Protein: 6.2 g/dL (ref 6.0–8.3)

## 2019-11-26 ENCOUNTER — Ambulatory Visit (INDEPENDENT_AMBULATORY_CARE_PROVIDER_SITE_OTHER): Payer: BC Managed Care – PPO | Admitting: Psychiatry

## 2019-11-26 ENCOUNTER — Encounter: Payer: Self-pay | Admitting: Psychiatry

## 2019-11-26 VITALS — BP 131/93 | HR 71

## 2019-11-26 DIAGNOSIS — F4010 Social phobia, unspecified: Secondary | ICD-10-CM

## 2019-11-26 DIAGNOSIS — F1021 Alcohol dependence, in remission: Secondary | ICD-10-CM

## 2019-11-26 DIAGNOSIS — G4700 Insomnia, unspecified: Secondary | ICD-10-CM | POA: Diagnosis not present

## 2019-11-26 DIAGNOSIS — F332 Major depressive disorder, recurrent severe without psychotic features: Secondary | ICD-10-CM | POA: Diagnosis not present

## 2019-11-26 DIAGNOSIS — F331 Major depressive disorder, recurrent, moderate: Secondary | ICD-10-CM | POA: Diagnosis not present

## 2019-11-26 DIAGNOSIS — F10988 Alcohol use, unspecified with other alcohol-induced disorder: Secondary | ICD-10-CM | POA: Diagnosis not present

## 2019-11-26 LAB — HEPATITIS B SURFACE ANTIBODY,QUALITATIVE: Hep B S Ab: NONREACTIVE

## 2019-11-26 LAB — HEPATITIS C ANTIBODY
Hepatitis C Ab: NONREACTIVE
SIGNAL TO CUT-OFF: 0.01 (ref <1.00)

## 2019-11-26 LAB — HEPATITIS B SURFACE ANTIGEN: Hepatitis B Surface Ag: NONREACTIVE

## 2019-11-26 MED ORDER — GABAPENTIN 600 MG PO TABS: 600.0000 mg | ORAL_TABLET | Freq: Every day | ORAL | 1 refills | Status: DC

## 2019-11-26 MED ORDER — ACAMPROSATE CALCIUM 333 MG PO TBEC: 666.0000 mg | DELAYED_RELEASE_TABLET | Freq: Three times a day (TID) | ORAL | 0 refills | Status: DC

## 2019-11-26 MED ORDER — SERTRALINE HCL 50 MG PO TABS: 50.0000 mg | ORAL_TABLET | Freq: Every day | ORAL | 0 refills | Status: DC

## 2019-11-26 MED ORDER — TRAZODONE HCL 50 MG PO TABS: 50.0000 mg | ORAL_TABLET | Freq: Every evening | ORAL | 0 refills | Status: DC | PRN

## 2019-11-26 NOTE — Progress Notes (Signed)
FRANSISCO MESSMER [id number] 1986-02-09 34 y.o.  Subjective:   Patient ID:  WILLE AUBUCHON is a 34 y.o. (DOB 1985-06-30) male.  Chief Complaint:  Chief Complaint  Patient presents with  . Alcohol Problem  . Depression  . Anxiety    HPI JANIE STROTHMAN presents to the office today for follow-up of ETOH abuse and mood disturbance. He reports that on May 15th he started drinking again after being emailed from someone he was in a relationship with. He reports that he then made a choice he regretted and the next day had suicidal thoughts and told his parents. Parents then took him to the ER and he was admitted to [facility]. He reports that after discharge he went to [facility] and he is currently attending their outpatient program through Zoom 5 days a week and will transition to IOP next week for 6-8 weeks. He reports that this has been helpful.   He reports that mood has been "ok." Reports that he usually rates his mood a 5 or 6 with 10=the best mood imaginable. Anxiety was elevated after hospitalization.  Reports anxiety is now a 2 out of 10 with 10= most anxiety imaginable. Deep breathing has been helpful for anxiety. Has had to redirect thoughts from worrying about the future and negative events from the past. Denies impulsivity other than when he has been drinking. Denies irritability. Sleeping well and averaging 7-8 hours a night. He reports that his appetite has been good. Energy and motivation have been "fine." Concentration has been adequate. Denies any SI since hospital discharge.   Denies AH, VH, or intrusive thoughts.   Denies any ETOH use since hospitalization. He denies any cravings to drink. Attended first AA meeting virtually on Monday.   Taking hydroxyzine prn.    Past Psychiatric Medication Trials: Ambien- Took x 1 Gabapentin- Started a few weeks ago. Has been helpful for sleep.  Sertraline Xanax Lorazepam Hydroxyzine  GAD-7     Office Visit from 10/30/2019 in  [facility]  Total GAD-7 Score  3    PHQ2-9     Office Visit from 10/30/2019 in [facility] Office Visit from 10/10/2019 in [facility]  PHQ-2 Total Score  1  4  PHQ-9 Total Score  4  16       Review of Systems:  Review of Systems  Gastrointestinal: Positive for nausea. Negative for diarrhea and vomiting.  Musculoskeletal: Positive for back pain and neck pain. Negative for gait problem.  Neurological: Negative for tremors.  Psychiatric/Behavioral:       Please refer to HPI    Medications: I have reviewed the patient's current medications.  Current Outpatient Medications  Medication Sig Dispense Refill  . acamprosate (CAMPRAL) 333 MG tablet Take 2 tablets (666 mg total) by mouth 3 (three) times daily with meals. For alcoholism 180 tablet 0  . finasteride (PROPECIA) 1 MG tablet Take 1 mg by mouth at bedtime.     . hydrOXYzine (ATARAX/VISTARIL) 25 MG tablet Take 1 tablet (25 mg total) by mouth 3 (three) times daily as needed for anxiety. 75 tablet 0  . Multiple Vitamin (MULTIVITAMIN WITH MINERALS) TABS Take 1 tablet by mouth daily.    . Omega-3 Fatty Acids (FISH OIL) 1000 MG CAPS Take by mouth.    . pyridOXINE (VITAMIN B-6) 100 MG tablet Take 100 mg by mouth daily. [facility]    . sertraline (ZOLOFT) 50 MG tablet Take 1 tablet (50 mg total)  by mouth daily. For depression 30 tablet 0  . SYNTHROID 100 MCG tablet Take 1 tablet (100 mcg total) by mouth daily. 90 tablet 1  . thiamine 100 MG tablet Take 1 tablet (100 mg total) by mouth daily. For thiamine replacement 30 tablet 0  . traZODone (DESYREL) 50 MG tablet Take 1 tablet (50 mg total) by mouth at bedtime as needed for sleep. 30 tablet 0  . vitamin B-12 (CYANOCOBALAMIN) 1000 MCG tablet Take 1,000 mcg by mouth daily.     Marland Kitchen gabapentin (NEURONTIN) 600 MG tablet Take 1 tablet (600 mg total) by mouth at bedtime. 30 tablet 1   No current facility-administered medications for this visit.     Medication Side Effects: Other: Nausea at times.   Had appetite suppression initially.  Allergies: No Known Allergies  Past Medical History:  Diagnosis Date  . Acne   . Allergic rhinitis   . Compartment syndrome (HCC) 2010   right leg  . Depression   . Hypothyroidism   . Sever's disease     Family History  Problem Relation Age of Onset  . Prostate cancer Father 32  . Autism Brother   . Diabetes Other   . Allergies Other   . Alcohol abuse Other        mom's side of the family  . Alcohol abuse Maternal Grandfather     Social History   Socioeconomic History  . Marital status: Single    Spouse name: Not on file  . Number of children: Not on file  . Years of education: Not on file  . Highest education level: Not on file  Occupational History  . Not on file  Tobacco Use  . Smoking status: Former Smoker    Types: Cigarettes    Quit date: 09/26/2019    Years since quitting: 0.1  . Smokeless tobacco: Former Neurosurgeon    Types: Snuff  . Tobacco comment: 04/30/2012 "smoked & used snuff maybe for 1 wk total"  Substance and Sexual Activity  . Alcohol use: Yes    Alcohol/week: 10.0 standard drinks    Types: 10 Shots of liquor per week    Comment: Drinks a 5th daily  . Drug use: No  . Sexual activity: Yes    Birth control/protection: None  Other Topics Concern  . 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 Difficulty of Paying Living Expenses:   Food Insecurity:   . Worried About Programme researcher, broadcasting/film/video in the Last Year:   . Barista in the Last Year:   Transportation Needs:   . Freight forwarder (Medical):   Marland Kitchen Lack of Transportation (Non-Medical):   Physical Activity:   . Days of Exercise per Week:   . Minutes of Exercise per Session:    Stress:   . Feeling of Stress :   Social Connections:   . Frequency of Communication with Friends and Family:   . Frequency of Social Gatherings with Friends and Family:   . Attends Religious Services:   . Active Member of Clubs or Organizations:   . Attends Banker Meetings:   Marland Kitchen Marital Status:   Intimate Partner Violence:   .  Fear of Current or Ex-Partner:   . Emotionally Abused:   Marland Kitchen Physically Abused:   . Sexually Abused:     Past Medical History, Surgical history, Social history, and Family history were reviewed and updated as appropriate.   Please see review of systems for further details on the patient's review from today.   Objective:   Physical Exam:  BP (!) 131/93   Pulse 71   Physical Exam Constitutional:      General: He is not in acute distress. Musculoskeletal:        General: No deformity.  Neurological:     Mental Status: He is alert and oriented to person, place, and time.     Coordination: Coordination normal.  Psychiatric:        Attention and Perception: Attention and perception normal. He does not perceive auditory or visual hallucinations.        Mood and Affect: Mood is anxious. Mood is not depressed. Affect is not labile, blunt, angry or inappropriate.        Speech: Speech normal.        Behavior: Behavior normal.        Thought Content: Thought content normal. Thought content is not paranoid or delusional. Thought content does not include homicidal or suicidal ideation. Thought content does not include homicidal or suicidal plan.        Cognition and Memory: Cognition and memory normal.        Judgment: Judgment normal.     Comments: Insight intact     Lab Review:     Component Value Date/Time   NA 140 11/09/2019 1842   K 4.1 11/09/2019 1842   CL 103 11/09/2019 1842   CO2 25 11/09/2019 1842   GLUCOSE 86 11/09/2019 1842   BUN 14 11/09/2019 1842   CREATININE 0.99 11/09/2019 1842   CREATININE 1.17 10/10/2019 1526   CALCIUM  8.9 11/09/2019 1842   PROT 6.2 11/25/2019 0724   ALBUMIN 4.3 11/25/2019 0724   AST 29 11/25/2019 0724   ALT 17 11/25/2019 0724   ALKPHOS 49 11/25/2019 0724   BILITOT 0.4 11/25/2019 0724   GFRNONAA >60 11/09/2019 1842   GFRAA >60 11/09/2019 1842       Component Value Date/Time   WBC 6.8 11/09/2019 1842   RBC 4.51 11/09/2019 1842   HGB 14.5 11/09/2019 1842   HCT 44.7 11/09/2019 1842   PLT 203 11/09/2019 1842   MCV 99.1 11/09/2019 1842   MCV 95.4 08/12/2011 1021   MCH 32.2 11/09/2019 1842   MCHC 32.4 11/09/2019 1842   RDW 12.4 11/09/2019 1842   LYMPHSABS 1,279 10/10/2019 1526   MONOABS 0.7 04/30/2012 1332   EOSABS 17 10/10/2019 1526   BASOSABS 26 10/10/2019 1526    No results found for: POCLITH, LITHIUM   No results found for: PHENYTOIN, PHENOBARB, VALPROATE, CBMZ   .res Assessment: Plan:   45 minutes spent counseling pt and reviewing documentation from recent hospitalization. Reviewed medications that were started during hospitalization and discussed potential benefits, risks, and side effects of new medications. Encouraged pt to try taking Sertraline with a meal to minimize risk of nausea. Discussed that he could also change administration time to HS to avoid nausea during the day. Will continue current dose of Sertraline at this time due to possible nausea and discussed that increased titration of Sertraline may be indicated in the future if nausea subsides. Discussed that hydroxyzine can cause drowsiness and that he may wish to take hydroxyzine prn later  in the day and only as needed during the day.  Continue Sertraline 50 mg po qd for mood and anxiety.  Continue Campral for ETOH dependence.  Continue Hydroxyzine prn for anxiety.  Continue Trazodone 50 mg po QHS prn insomnia. Continue Gabapentin 600 mg po QHS for insomnia and anxiety. Recommend continuing outpatient program at [facility]. Recommend continuing psychotherapy with Bradley Ferris, LCAS.  Pt to f/u in 4 weeks or  sooner if clinically indicated.  Patient advised to contact office with any questions, adverse effects, or acute worsening in signs and symptoms.  [place] was seen today for alcohol problem, depression and anxiety.  Diagnoses and all orders for this visit:  Personal history of alcoholism (HCC) -     acamprosate (CAMPRAL) 333 MG tablet; Take 2 tablets (666 mg total) by mouth 3 (three) times daily with meals. For alcoholism  Social anxiety disorder -     gabapentin (NEURONTIN) 600 MG tablet; Take 1 tablet (600 mg total) by mouth at bedtime. -     sertraline (ZOLOFT) 50 MG tablet; Take 1 tablet (50 mg total) by mouth daily. For depression  Severe recurrent major depression without psychotic features (HCC) -     sertraline (ZOLOFT) 50 MG tablet; Take 1 tablet (50 mg total) by mouth daily. For depression  Insomnia, unspecified type -     gabapentin (NEURONTIN) 600 MG tablet; Take 1 tablet (600 mg total) by mouth at bedtime. -     traZODone (DESYREL) 50 MG tablet; Take 1 tablet (50 mg total) by mouth at bedtime as needed for sleep.     Please see After Visit Summary for patient specific instructions.  Future Appointments  Date Time Provider Department Center  12/23/2019  9:00 AM Corie Chiquito, PMHNP [facility] None  12/26/2019  9:00 AM Panosh, Neta Mends, MD [id number]    No orders of the defined types were placed in this encounter.   -------------------------------

## 2019-11-27 DIAGNOSIS — F10988 Alcohol use, unspecified with other alcohol-induced disorder: Secondary | ICD-10-CM | POA: Diagnosis not present

## 2019-11-27 DIAGNOSIS — F331 Major depressive disorder, recurrent, moderate: Secondary | ICD-10-CM | POA: Diagnosis not present

## 2019-11-28 DIAGNOSIS — F331 Major depressive disorder, recurrent, moderate: Secondary | ICD-10-CM | POA: Diagnosis not present

## 2019-11-28 DIAGNOSIS — F10988 Alcohol use, unspecified with other alcohol-induced disorder: Secondary | ICD-10-CM | POA: Diagnosis not present

## 2019-12-01 DIAGNOSIS — F10988 Alcohol use, unspecified with other alcohol-induced disorder: Secondary | ICD-10-CM | POA: Diagnosis not present

## 2019-12-01 DIAGNOSIS — F331 Major depressive disorder, recurrent, moderate: Secondary | ICD-10-CM | POA: Diagnosis not present

## 2019-12-01 NOTE — Progress Notes (Signed)
Liver tests now normal  and negative hep b and c screening

## 2019-12-02 DIAGNOSIS — F10988 Alcohol use, unspecified with other alcohol-induced disorder: Secondary | ICD-10-CM | POA: Diagnosis not present

## 2019-12-02 DIAGNOSIS — F331 Major depressive disorder, recurrent, moderate: Secondary | ICD-10-CM | POA: Diagnosis not present

## 2019-12-03 DIAGNOSIS — F331 Major depressive disorder, recurrent, moderate: Secondary | ICD-10-CM | POA: Diagnosis not present

## 2019-12-03 DIAGNOSIS — F10988 Alcohol use, unspecified with other alcohol-induced disorder: Secondary | ICD-10-CM | POA: Diagnosis not present

## 2019-12-04 DIAGNOSIS — F331 Major depressive disorder, recurrent, moderate: Secondary | ICD-10-CM | POA: Diagnosis not present

## 2019-12-04 DIAGNOSIS — F10988 Alcohol use, unspecified with other alcohol-induced disorder: Secondary | ICD-10-CM | POA: Diagnosis not present

## 2019-12-05 DIAGNOSIS — F331 Major depressive disorder, recurrent, moderate: Secondary | ICD-10-CM | POA: Diagnosis not present

## 2019-12-05 DIAGNOSIS — F10988 Alcohol use, unspecified with other alcohol-induced disorder: Secondary | ICD-10-CM | POA: Diagnosis not present

## 2019-12-08 DIAGNOSIS — F332 Major depressive disorder, recurrent severe without psychotic features: Secondary | ICD-10-CM | POA: Diagnosis not present

## 2019-12-10 DIAGNOSIS — F332 Major depressive disorder, recurrent severe without psychotic features: Secondary | ICD-10-CM | POA: Diagnosis not present

## 2019-12-12 DIAGNOSIS — F332 Major depressive disorder, recurrent severe without psychotic features: Secondary | ICD-10-CM | POA: Diagnosis not present

## 2019-12-15 DIAGNOSIS — F332 Major depressive disorder, recurrent severe without psychotic features: Secondary | ICD-10-CM | POA: Diagnosis not present

## 2019-12-17 DIAGNOSIS — F332 Major depressive disorder, recurrent severe without psychotic features: Secondary | ICD-10-CM | POA: Diagnosis not present

## 2019-12-19 DIAGNOSIS — F332 Major depressive disorder, recurrent severe without psychotic features: Secondary | ICD-10-CM | POA: Diagnosis not present

## 2019-12-22 DIAGNOSIS — F332 Major depressive disorder, recurrent severe without psychotic features: Secondary | ICD-10-CM | POA: Diagnosis not present

## 2019-12-23 ENCOUNTER — Encounter: Payer: Self-pay | Admitting: Psychiatry

## 2019-12-23 ENCOUNTER — Other Ambulatory Visit: Payer: Self-pay

## 2019-12-23 ENCOUNTER — Ambulatory Visit (INDEPENDENT_AMBULATORY_CARE_PROVIDER_SITE_OTHER): Payer: BC Managed Care – PPO | Admitting: Psychiatry

## 2019-12-23 DIAGNOSIS — F4010 Social phobia, unspecified: Secondary | ICD-10-CM | POA: Diagnosis not present

## 2019-12-23 DIAGNOSIS — F332 Major depressive disorder, recurrent severe without psychotic features: Secondary | ICD-10-CM

## 2019-12-23 DIAGNOSIS — G4700 Insomnia, unspecified: Secondary | ICD-10-CM

## 2019-12-23 MED ORDER — SERTRALINE HCL 100 MG PO TABS: 100.0000 mg | ORAL_TABLET | Freq: Every day | ORAL | 1 refills | Status: DC

## 2019-12-23 MED ORDER — TRAZODONE HCL 50 MG PO TABS: 50.0000 mg | ORAL_TABLET | Freq: Every evening | ORAL | 1 refills | Status: DC | PRN

## 2019-12-23 MED ORDER — GABAPENTIN 600 MG PO TABS: 600.0000 mg | ORAL_TABLET | Freq: Every day | ORAL | 1 refills | Status: DC

## 2019-12-24 DIAGNOSIS — F332 Major depressive disorder, recurrent severe without psychotic features: Secondary | ICD-10-CM | POA: Diagnosis not present

## 2019-12-24 NOTE — Progress Notes (Signed)
Patrick Hamilton [id number] 1986-03-14 34 y.o.  Subjective:   Patient ID:  Patrick Hamilton is a 34 y.o. (DOB 1985/11/17) male.  Chief Complaint:  Chief Complaint  Patient presents with  . Anxiety  . Depression  . Alcohol Problem    HPI [name] presents to the office today for follow-up of anxiety, depression, insomnia, and ETOH abuse. He reports that he was having some nausea around lunch time. He reports that PHP/IOP psychiatrist recommended stopping Campral and nausea has resolved. He has moved Sertraline to HS since it was causing some fatigue in the day when taking Sertraline in the morning. He reports that his mood has been good. Notices improved energy, motivation, and concentration. He reports some rumination several weeks ago while on vacation. He reports that this has improved some. He reports that he has been communicating and opening up more with his parents and finds this helpful and has scheduled time each week to talk with parents. He reports some occasional worry and thinking about past situations. He reports that he has been in the process of accepting things that happened. He reports irritability is improving some. Experienced some sadness on vacation. Sleeping well. Occ experiencing early morning awakening. He reports sleep quality is good to fair. Appetite has been good. Has been exploring intermittent fasting. He reports that he has been trying to focus on staying healthy and self-care. He reports that he has some passive death wishes while on vacation. Denies any SI currently. Denies AH or VH.   He is now attending IOP virtually with [facility]. Continues to see Bradley Ferris, LCAS for therapy.   Denies any ETOh cravings or use. Was on vacation 6/13-6/20/21.   Past Psychiatric Medication Trials: Ambien- Took x 1 Gabapentin- Started a few weeks ago. Has been helpful for sleep. Sertraline Xanax Lorazepam Hydroxyzine     GAD-7     Office Visit from 10/30/2019 in  [facility]  Total GAD-7 Score 3    PHQ2-9     Office Visit from 10/30/2019 in [facility] Office Visit from 10/10/2019 in [facility] [place]  PHQ-2 Total Score 1 4  PHQ-9 Total Score 4 16       Review of Systems:  Review of Systems  Musculoskeletal: Negative for gait problem.  Neurological: Negative for tremors.  Psychiatric/Behavioral:       Please refer to HPI    Medications: I have reviewed the patient's current medications.  Current Outpatient Medications  Medication Sig Dispense Refill  . ARGININE PO Take by mouth.    . finasteride (PROPECIA) 1 MG tablet Take 1 mg by mouth at bedtime.     . gabapentin (NEURONTIN) 600 MG tablet Take 1 tablet (600 mg total) by mouth at bedtime. 30 tablet 1  . hydrOXYzine (ATARAX/VISTARIL) 25 MG tablet Take 1 tablet (25 mg total) by mouth 3 (three) times daily as needed for anxiety. (Patient taking differently: Take 25 mg by mouth at bedtime. ) 75 tablet 0  . MELATONIN PO Take by mouth.    . Multiple Vitamin (MULTIVITAMIN WITH MINERALS) TABS Take 1 tablet by mouth daily.    . Omega-3 Fatty Acids (FISH OIL) 1000 MG CAPS Take by mouth.    . pyridOXINE (VITAMIN B-6) 100 MG tablet Take 100 mg by mouth daily. [facility]    . sertraline (ZOLOFT) 100 MG tablet Take 1 tablet (100 mg total) by mouth daily. 30 tablet 1  . SYNTHROID 100 MCG tablet Take  1 tablet (100 mcg total) by mouth daily. 90 tablet 1  . thiamine 100 MG tablet Take 1 tablet (100 mg total) by mouth daily. For thiamine replacement 30 tablet 0  . traZODone (DESYREL) 50 MG tablet Take 1 tablet (50 mg total) by mouth at bedtime as needed for sleep. 30 tablet 1  . vitamin B-12 (CYANOCOBALAMIN) 1000 MCG tablet Take 1,000 mcg by mouth daily.      No current facility-administered medications for this visit.    Medication Side Effects: None  Allergies: No Known Allergies  Past Medical History:  Diagnosis Date  . Acne   . Allergic rhinitis    . Compartment syndrome (HCC) 2010   right leg  . Depression   . Hypothyroidism   . Sever's disease     Family History  Problem Relation Age of Onset  . Prostate cancer Father 57  . Autism Brother   . Diabetes Other   . Allergies Other   . Alcohol abuse Other        mom's side of the family  . Alcohol abuse Maternal Grandfather     Social History   Socioeconomic History  . Marital status: Single    Spouse name: Not on file  . Number of children: Not on file  . Years of education: Not on file  . Highest education level: Not on file  Occupational History  . Not on file  Tobacco Use  . Smoking status: Former Smoker    Types: Cigarettes    Quit date: 09/26/2019    Years since quitting: 0.2  . Smokeless tobacco: Former Neurosurgeon    Types: Snuff  . Tobacco comment: 04/30/2012 "smoked & used snuff maybe for 1 wk total"  Vaping Use  . Vaping Use: Never used  Substance and Sexual Activity  . Alcohol use: Yes    Alcohol/week: 10.0 standard drinks    Types: 10 Shots of liquor per week    Comment: Drinks a 5th daily  . Drug use: No  . Sexual activity: Yes    Birth control/protection: None  Other Topics Concern  . 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 Difficulty of Paying Living Expenses:   Food Insecurity:   . Worried About Programme researcher, broadcasting/film/video in the Last Year:   . Barista in the Last Year:   Transportation Needs:   . Freight forwarder (Medical):   Marland Kitchen Lack of Transportation (Non-Medical):   Physical Activity:   . Days of Exercise per Week:   . Minutes of Exercise per Session:   Stress:   . Feeling of Stress :   Social Connections:   . Frequency of Communication with Friends and Family:   . Frequency of Social Gatherings with  Friends and Family:   . Attends Religious Services:   . Active Member of Clubs or Organizations:   . Attends Banker Meetings:   Marland Kitchen Marital Status:   Intimate Partner Violence:   . Fear of Current or Ex-Partner:   . Emotionally Abused:   Marland Kitchen Physically Abused:   . Sexually Abused:     Past Medical History, Surgical history, Social history, and Family history  were reviewed and updated as appropriate.   Please see review of systems for further details on the patient's review from today.   Objective:   Physical Exam:  There were no vitals taken for this visit.  Physical Exam Constitutional:      General: He is not in acute distress. Musculoskeletal:        General: No deformity.  Neurological:     Mental Status: He is alert and oriented to person, place, and time.     Coordination: Coordination normal.  Psychiatric:        Attention and Perception: Attention and perception normal. He does not perceive auditory or visual hallucinations.        Mood and Affect: Mood is anxious. Affect is not labile, blunt, angry or inappropriate.        Speech: Speech normal.        Behavior: Behavior normal.        Thought Content: Thought content normal. Thought content is not paranoid or delusional. Thought content does not include homicidal or suicidal ideation. Thought content does not include homicidal or suicidal plan.        Cognition and Memory: Cognition and memory normal.        Judgment: Judgment normal.     Comments: Insight intact Mood presents as less depressed     Lab Review:     Component Value Date/Time   NA 140 11/09/2019 1842   K 4.1 11/09/2019 1842   CL 103 11/09/2019 1842   CO2 25 11/09/2019 1842   GLUCOSE 86 11/09/2019 1842   BUN 14 11/09/2019 1842   CREATININE 0.99 11/09/2019 1842   CREATININE 1.17 10/10/2019 1526   CALCIUM 8.9 11/09/2019 1842   PROT 6.2 11/25/2019 0724   ALBUMIN 4.3 11/25/2019 0724   AST 29 11/25/2019 0724   ALT 17 11/25/2019 0724    ALKPHOS 49 11/25/2019 0724   BILITOT 0.4 11/25/2019 0724   GFRNONAA >60 11/09/2019 1842   GFRAA >60 11/09/2019 1842       Component Value Date/Time   WBC 6.8 11/09/2019 1842   RBC 4.51 11/09/2019 1842   HGB 14.5 11/09/2019 1842   HCT 44.7 11/09/2019 1842   PLT 203 11/09/2019 1842   MCV 99.1 11/09/2019 1842   MCV 95.4 08/12/2011 1021   MCH 32.2 11/09/2019 1842   MCHC 32.4 11/09/2019 1842   RDW 12.4 11/09/2019 1842   LYMPHSABS 1,279 10/10/2019 1526   MONOABS 0.7 04/30/2012 1332   EOSABS 17 10/10/2019 1526   BASOSABS 26 10/10/2019 1526    No results found for: POCLITH, LITHIUM   No results found for: PHENYTOIN, PHENOBARB, VALPROATE, CBMZ   .res Assessment: Plan:   Discussed potential benefits, risks, and side effects of increasing Sertraline to 100 mg po qd for depression and anxiety. Pt agrees to increase in Sertraline to 100 mg po qd.  Continue Trazodone 50 mg po QHS for insomnia.  Continue hydroxyzine prn anxiety and insomnia.  Continue Gabapentin 600 mg po QHS for insomnia.  Re[facility]. Pt to f/u in 4 weeks or sooner if clinically indicated.  Patient advised to contact office with any questions, adverse effects, or acute worsening in signs and symptoms.  Nikitas was seen today for anxiety, depression and alcohol problem.  Diagnoses and all orders for this visit:  Social anxiety disorder -     sertraline (ZOLOFT) 100 MG tablet; Take 1 tablet (100 mg total) by mouth daily. -  gabapentin (NEURONTIN) 600 MG tablet; Take 1 tablet (600 mg total) by mouth at bedtime.  Severe recurrent major depression without psychotic features (HCC) -     sertraline (ZOLOFT) 100 MG tablet; Take 1 tablet (100 mg total) by mouth daily.  Insomnia, unspecified type -     gabapentin (NEURONTIN) 600 MG tablet; Take 1 tablet (600 mg total) by mouth at bedtime. -     traZODone (DESYREL) 50 MG tablet; Take 1 tablet (50 mg total) by mouth at  bedtime as needed for sleep.     Please see After Visit Summary for patient specific instructions.  Future Appointments  Date Time Provider Department Center  12/26/2019  9:00 AM Panosh, Neta Mends, MD [facility]  01/20/2020 10:15 AM Corie Chiquito, PMHNP [facility] None    No orders of the defined types were placed in this encounter.   -------------------------------

## 2019-12-25 NOTE — Progress Notes (Signed)
Chief Complaint  Patient presents with  . Follow-up    Doing a little better    HPI: SKANDA WORLDS 34 y.o. come in for  Fu a number of issues    in care psych   [facility] 3 d per week.  Program after IP  Hospitalization   In May 2021 And  [facility]    Digging out and doing "ok" working on things  Knee is better cont to exercise  subst free at this time . Future worries medical ? candidate for hair transplant.  Taking thyroid med qd had labs  Earlier  Last month   ROS: See pertinent positives and negatives per HPI.  Past Medical History:  Diagnosis Date  . Acne   . Allergic rhinitis   . Compartment syndrome (HCC) 2010   right leg  . Depression   . Hypothyroidism   . Sever's disease     Family History  Problem Relation Age of Onset  . Prostate cancer Father 15  . Autism Brother   . Diabetes Other   . Allergies Other   . Alcohol abuse Other        mom's side of the family  . Alcohol abuse Maternal Grandfather     Social History   Socioeconomic History  . Marital status: Single    Spouse name: Not on file  . Number of children: Not on file  . Years of education: Not on file  . Highest education level: Not on file  Occupational History  . Not on file  Tobacco Use  . Smoking status: Former Smoker    Types: Cigarettes    Quit date: 09/26/2019    Years since quitting: 0.2  . Smokeless tobacco: Former Neurosurgeon    Types: Snuff  . Tobacco comment: 04/30/2012 "smoked & used snuff maybe for 1 wk total"  Vaping Use  . Vaping Use: Never used  Substance and Sexual Activity  . Alcohol use: Yes    Alcohol/week: 10.0 standard drinks    Types: 10 Shots of liquor per week    Comment: Drinks a 5th daily  . Drug use: No  . Sexual activity: Yes    Birth control/protection: None  Other Topics Concern  . 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 Difficulty of Paying Living Expenses:   Food Insecurity:   . Worried About Programme researcher, broadcasting/film/video in the Last Year:   . Barista in the Last Year:   Transportation Needs:   . Freight forwarder (Medical):   Marland Kitchen Lack of Transportation (Non-Medical):   Physical Activity:   . Days of Exercise per Week:   . Minutes of Exercise per Session:   Stress:   . Feeling of Stress :   Social Connections:   . Frequency of Communication with Friends and Family:   . Frequency of Social Gatherings with Friends and Family:   . Attends Religious Services:   . Active Member of Clubs or Organizations:   . Attends Banker Meetings:   Marland Kitchen Marital Status:     Outpatient Medications Prior to Visit  Medication Sig Dispense Refill  . ARGININE PO Take  by mouth.    . Cholecalciferol (VITAMIN D3) 125 MCG (5000 UT) TABS Take by mouth.    . fexofenadine (ALLEGRA) 180 MG tablet Take 180 mg by mouth daily.    . finasteride (PROPECIA) 1 MG tablet Take 1 mg by mouth at bedtime.     . gabapentin (NEURONTIN) 600 MG tablet Take 1 tablet (600 mg total) by mouth at bedtime. 30 tablet 1  . hydrOXYzine (ATARAX/VISTARIL) 25 MG tablet Take 1 tablet (25 mg total) by mouth 3 (three) times daily as needed for anxiety. (Patient taking differently: Take 25 mg by mouth at bedtime. ) 75 tablet 0  . MELATONIN PO Take by mouth.    . Multiple Vitamin (MULTIVITAMIN WITH MINERALS) TABS Take 1 tablet by mouth daily.    . Omega-3 Fatty Acids (FISH OIL) 1000 MG CAPS Take by mouth.    . pyridOXINE (VITAMIN B-6) 100 MG tablet Take 100 mg by mouth daily. [facility]    . sertraline (ZOLOFT) 100 MG tablet Take 1 tablet (100 mg total) by mouth daily. 30 tablet 1  . SYNTHROID 100 MCG tablet Take 1 tablet (100 mcg total) by mouth daily. 90 tablet 1  . thiamine 100 MG tablet Take 1 tablet (100 mg total) by mouth daily. For thiamine  replacement 30 tablet 0  . traZODone (DESYREL) 50 MG tablet Take 1 tablet (50 mg total) by mouth at bedtime as needed for sleep. 30 tablet 1  . vitamin B-12 (CYANOCOBALAMIN) 1000 MCG tablet Take 1,000 mcg by mouth daily.      No facility-administered medications prior to visit.     EXAM:  BP 120/72   Pulse 88   Temp 98.5 F (36.9 C) (Temporal)   Ht 6' 1.5" (1.867 m)   Wt 205 lb 3.2 oz (93.1 kg)   SpO2 98%   BMI 26.71 kg/m   Body mass index is 26.71 kg/m. Wt Readings from Last 3 Encounters:  12/26/19 205 lb 3.2 oz (93.1 kg)  10/27/19 203 lb 3.2 oz (92.2 kg)  10/10/19 192 lb 9.6 oz (87.4 kg)    GENERAL: vitals reviewed and listed above, alert, oriented, appears well hydrated and in no acute distress HEENT: atraumatic, conjunctiva  clear, no obvious abnormalities on inspection of external nose and ears OP : masked  Slight stare?  NECK: no obvious masses on inspection palpation  Thyroid pal not nodules  LUNGS: clear to auscultation bilaterally, no wheezes, rales or rhonchi, good air movement CV: HRRR, no clubbing cyanosis or  peripheral edema nl cap refill  MS: moves all extremities without noticeable focal  Abnormality Abdomen:  Sof,t normal bowel sounds without hepatosplenomegaly, no guarding rebound or masses no CVA tenderness Skin   Hair good hair   Full head at this time  PSYCH: pleasant and cooperative, no obvious depression or anxiety Lab Results  Component Value Date   WBC 6.8 11/09/2019   HGB 14.5 11/09/2019   HCT 44.7 11/09/2019   PLT 203 11/09/2019   GLUCOSE 86 11/09/2019   CHOL 147 11/10/2019   TRIG 99 11/10/2019   HDL 32 (L) 11/10/2019   LDLCALC 95 11/10/2019   ALT 17 11/25/2019   AST 29 11/25/2019   NA 140 11/09/2019   K 4.1 11/09/2019   CL 103 11/09/2019   CREATININE 0.99 11/09/2019   BUN 14 11/09/2019   CO2 25 11/09/2019   TSH 2.05 11/25/2019   PSA 0.97 08/12/2011   INR 1.18 04/30/2012   HGBA1C 5.0 11/10/2019   BP  Readings from Last 3  Encounters:  12/26/19 120/72  11/09/19 (!) 147/67  10/27/19 130/78    ASSESSMENT AND PLAN:  Discussed the following assessment and plan:  Subclinical hypothyroidism - tsh in range   Medication management  Elevated liver enzymes - resolved  prob from substance no longer   Severe recurrent major depression without psychotic features (HCC) - under speciality rx care   ALCOHOL ABUSE, HX OF In supportive rx care  Disc hair  And  In future could see dermatology in future  With hair seemingly ok  Can stay on med  Plan rov in 6 mos depending  -Patient advised to return or notify health care team  if  new concerns arise.  Patient Instructions  Continue lifestyle intervention healthy eating and exercise .  Labs are now normal. ROV  In 6 mos   Or as needed in interim     Neta Mends. Malikah Lakey M.D.

## 2019-12-26 ENCOUNTER — Encounter: Payer: Self-pay | Admitting: Internal Medicine

## 2019-12-26 ENCOUNTER — Ambulatory Visit (INDEPENDENT_AMBULATORY_CARE_PROVIDER_SITE_OTHER): Payer: BC Managed Care – PPO | Admitting: Internal Medicine

## 2019-12-26 ENCOUNTER — Other Ambulatory Visit: Payer: Self-pay

## 2019-12-26 VITALS — BP 120/72 | HR 88 | Temp 98.5°F | Ht 73.5 in | Wt 205.2 lb

## 2019-12-26 DIAGNOSIS — R748 Abnormal levels of other serum enzymes: Secondary | ICD-10-CM | POA: Diagnosis not present

## 2019-12-26 DIAGNOSIS — Z79899 Other long term (current) drug therapy: Secondary | ICD-10-CM | POA: Diagnosis not present

## 2019-12-26 DIAGNOSIS — E038 Other specified hypothyroidism: Secondary | ICD-10-CM

## 2019-12-26 DIAGNOSIS — F332 Major depressive disorder, recurrent severe without psychotic features: Secondary | ICD-10-CM

## 2019-12-26 DIAGNOSIS — E039 Hypothyroidism, unspecified: Secondary | ICD-10-CM | POA: Diagnosis not present

## 2019-12-26 DIAGNOSIS — F1020 Alcohol dependence, uncomplicated: Secondary | ICD-10-CM | POA: Diagnosis not present

## 2019-12-26 DIAGNOSIS — F329 Major depressive disorder, single episode, unspecified: Secondary | ICD-10-CM | POA: Diagnosis not present

## 2019-12-26 DIAGNOSIS — F1021 Alcohol dependence, in remission: Secondary | ICD-10-CM

## 2019-12-26 NOTE — Patient Instructions (Signed)
Continue lifestyle intervention healthy eating and exercise .  Labs are now normal. ROV  In 6 mos   Or as needed in interim

## 2019-12-29 DIAGNOSIS — F329 Major depressive disorder, single episode, unspecified: Secondary | ICD-10-CM | POA: Diagnosis not present

## 2019-12-29 DIAGNOSIS — F1020 Alcohol dependence, uncomplicated: Secondary | ICD-10-CM | POA: Diagnosis not present

## 2019-12-31 DIAGNOSIS — F1020 Alcohol dependence, uncomplicated: Secondary | ICD-10-CM | POA: Diagnosis not present

## 2019-12-31 DIAGNOSIS — F329 Major depressive disorder, single episode, unspecified: Secondary | ICD-10-CM | POA: Diagnosis not present

## 2020-01-02 ENCOUNTER — Other Ambulatory Visit: Payer: Self-pay

## 2020-01-02 DIAGNOSIS — F329 Major depressive disorder, single episode, unspecified: Secondary | ICD-10-CM | POA: Diagnosis not present

## 2020-01-02 DIAGNOSIS — F1020 Alcohol dependence, uncomplicated: Secondary | ICD-10-CM | POA: Diagnosis not present

## 2020-01-02 MED ORDER — FINASTERIDE 1 MG PO TABS: 1.0000 mg | ORAL_TABLET | Freq: Every day | ORAL | 4 refills | Status: DC

## 2020-01-02 NOTE — Telephone Encounter (Signed)
Please send in 1 mg finasteride to take q am  Disp 30 refill x 4  To pharmacy indicated   Thanks

## 2020-01-05 DIAGNOSIS — F329 Major depressive disorder, single episode, unspecified: Secondary | ICD-10-CM | POA: Diagnosis not present

## 2020-01-05 DIAGNOSIS — F1020 Alcohol dependence, uncomplicated: Secondary | ICD-10-CM | POA: Diagnosis not present

## 2020-01-07 DIAGNOSIS — F329 Major depressive disorder, single episode, unspecified: Secondary | ICD-10-CM | POA: Diagnosis not present

## 2020-01-07 DIAGNOSIS — F1020 Alcohol dependence, uncomplicated: Secondary | ICD-10-CM | POA: Diagnosis not present

## 2020-01-09 DIAGNOSIS — F1020 Alcohol dependence, uncomplicated: Secondary | ICD-10-CM | POA: Diagnosis not present

## 2020-01-09 DIAGNOSIS — F329 Major depressive disorder, single episode, unspecified: Secondary | ICD-10-CM | POA: Diagnosis not present

## 2020-01-12 DIAGNOSIS — F1020 Alcohol dependence, uncomplicated: Secondary | ICD-10-CM | POA: Diagnosis not present

## 2020-01-12 DIAGNOSIS — F329 Major depressive disorder, single episode, unspecified: Secondary | ICD-10-CM | POA: Diagnosis not present

## 2020-01-14 DIAGNOSIS — F1020 Alcohol dependence, uncomplicated: Secondary | ICD-10-CM | POA: Diagnosis not present

## 2020-01-14 DIAGNOSIS — F329 Major depressive disorder, single episode, unspecified: Secondary | ICD-10-CM | POA: Diagnosis not present

## 2020-01-16 DIAGNOSIS — F1020 Alcohol dependence, uncomplicated: Secondary | ICD-10-CM | POA: Diagnosis not present

## 2020-01-16 DIAGNOSIS — F329 Major depressive disorder, single episode, unspecified: Secondary | ICD-10-CM | POA: Diagnosis not present

## 2020-01-19 DIAGNOSIS — F1020 Alcohol dependence, uncomplicated: Secondary | ICD-10-CM | POA: Diagnosis not present

## 2020-01-19 DIAGNOSIS — F329 Major depressive disorder, single episode, unspecified: Secondary | ICD-10-CM | POA: Diagnosis not present

## 2020-01-20 ENCOUNTER — Other Ambulatory Visit: Payer: Self-pay

## 2020-01-20 ENCOUNTER — Encounter: Payer: Self-pay | Admitting: Psychiatry

## 2020-01-20 ENCOUNTER — Ambulatory Visit (INDEPENDENT_AMBULATORY_CARE_PROVIDER_SITE_OTHER): Payer: BC Managed Care – PPO | Admitting: Psychiatry

## 2020-01-20 DIAGNOSIS — F4010 Social phobia, unspecified: Secondary | ICD-10-CM

## 2020-01-20 DIAGNOSIS — G4700 Insomnia, unspecified: Secondary | ICD-10-CM

## 2020-01-20 DIAGNOSIS — F332 Major depressive disorder, recurrent severe without psychotic features: Secondary | ICD-10-CM

## 2020-01-20 MED ORDER — TRAZODONE HCL 50 MG PO TABS: 50.0000 mg | ORAL_TABLET | Freq: Every evening | ORAL | 1 refills | Status: DC | PRN

## 2020-01-20 MED ORDER — GABAPENTIN 600 MG PO TABS: 600.0000 mg | ORAL_TABLET | Freq: Every day | ORAL | 1 refills | Status: DC

## 2020-01-20 MED ORDER — SERTRALINE HCL 100 MG PO TABS: 100.0000 mg | ORAL_TABLET | Freq: Every day | ORAL | 1 refills | Status: DC

## 2020-01-20 NOTE — Progress Notes (Signed)
Patrick Hamilton [id number] May 27, 1986 34 y.o.  Subjective:   Patient ID:  Patrick Hamilton is a 34 y.o. (DOB [birth year]) male.  Chief Complaint:  Chief Complaint  Patient presents with  . Follow-up    h/o Anxiety, Depression, ETOH Dependence and insomnia    HPI TRAMPAS STETTNER presents to the office today for follow-up of anxiety, depression, insomnia, and Etoh dependence. Reports that he is having waves of fatigue in the afternoon, typically around 2-4 pm and wants to take a nap. Reports that this does not occur every day. Energy is otherwise adequate and improves after around 4 pm. Has been reducing caffeine intake and now drinking 2-3 cups of black tea and maybe one cup of coffee daily. Was drinking 5-6 cups of coffee daily.   He reports that his mood has been ok. Denies sad mood. Typically rates depressed mood a 2 or 3 out of 10, with 10= most sad. He reports that anxiety has been low. He reports some continued rumination about past events and reports that this has decreased some. He reports some worry about the future and reports that this is not "high." Sleeping well overall. Appetite has been good. He reports improved concentration and has been rating concentration a 7 out of 10, with 10= best concentration. Denies SI.   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 Attended a virtual job fair. Had an interview lab last week.   He reports that he had a period of increased rumination for 2-3 days and was able to talk to his mother and then this resolved.   Tomorrow is last day of IOP. Continues to see Bradley Ferris, [facility]. Has been to some AA meetings. Has created a partnership with someone in the neighbor. Reached out to a former Radio broadcast assistant and made plans for lunch.   Reports last ETOH use was 11/08/19. Denies cravings.   Past Psychiatric Medication Trials: Ambien- Took x 1 Gabapentin- Started a few weeks ago.  Has been helpful for sleep. Sertraline Xanax Lorazepam Hydroxyzine Trazodone   GAD-7     Office Visit from 01/20/2020 in [facility] Office Visit from 10/30/2019 in [facility]  Total GAD-7 Score 0 3    PHQ2-9     Office Visit from 01/20/2020 in [facility] Office Visit from 10/30/2019 in [facility] Office Visit from 10/10/2019 in [facility] [place]  PHQ-2 Total Score 1 1 4   PHQ-9 Total Score -- 4 16       Review of Systems:  Review of Systems  Gastrointestinal: Negative.   Musculoskeletal: Positive for neck pain. Negative for gait problem.  Neurological: Negative for tremors.  Psychiatric/Behavioral:       Please refer to HPI    Medications: I have reviewed the patient's current medications.  Current Outpatient Medications  Medication Sig Dispense Refill  . ARGININE PO Take by mouth.    . Cholecalciferol (VITAMIN D3) 250 MCG (10000 UT) capsule     . Cyanocobalamin (VITAMIN B-12) 5000 MCG TBDP Take 2,500 mcg by mouth daily.     . fexofenadine (ALLEGRA) 180 MG tablet Take 180 mg by mouth daily.    . finasteride (PROPECIA) 1 MG tablet Take 1 tablet (1 mg total) by mouth daily. 30 tablet 4  . gabapentin (NEURONTIN) 600 MG tablet Take 1 tablet (600 mg total) by mouth at bedtime. 30 tablet 1  .  hydrOXYzine (ATARAX/VISTARIL) 25 MG tablet Take 1 tablet (25 mg total) by mouth 3 (three) times daily as needed for anxiety. (Patient taking differently: Take 25 mg by mouth at bedtime. ) 75 tablet 0  . medium chain triglycerides (MCT OIL) oil Take by mouth daily.    Marland Kitchen. MELATONIN PO Take by mouth.    . Multiple Vitamin (MULTIVITAMIN WITH MINERALS) TABS Take 1 tablet by mouth daily.    . Omega-3 Fatty Acids (FISH OIL) 1000 MG CAPS Take by mouth.    . pyridOXINE (VITAMIN B-6) 100 MG tablet Take 100 mg by mouth daily. [facility]    . sertraline (ZOLOFT) 100 MG tablet Take 1 tablet (100 mg total) by mouth daily.  30 tablet 1  . SYNTHROID 100 MCG tablet Take 1 tablet (100 mcg total) by mouth daily. 90 tablet 1  . thiamine 100 MG tablet Take 1 tablet (100 mg total) by mouth daily. For thiamine replacement 30 tablet 0  . traZODone (DESYREL) 50 MG tablet Take 1 tablet (50 mg total) by mouth at bedtime as needed for sleep. 30 tablet 1  . UNABLE TO FIND Wheatgrass Juice    . VITAMIN K PO Take by mouth.     No current facility-administered medications for this visit.    Medication Side Effects: None  Allergies: No Known Allergies  Past Medical History:  Diagnosis Date  . Acne   . Allergic rhinitis   . Compartment syndrome (HCC) 2010   right leg  . Depression   . Hypothyroidism   . Sever's disease     Family History  Problem Relation Age of Onset  . Prostate cancer Father [id number]  . Autism Brother   . Diabetes Other   . Allergies Other   . Alcohol abuse Other        mom's side of the family  . Alcohol abuse Maternal Grandfather     Social History   Socioeconomic History  . Marital status: Single    Spouse name: Not on file  . Number of children: Not on file  . Years of education: Not on file  . Highest education level: Not on file  Occupational History  . Not on file  Tobacco Use  . Smoking status: Former Smoker    Types: Cigarettes    Quit date: 09/26/2019    Years since quitting: 0.3  . Smokeless tobacco: Former NeurosurgeonUser    Types: Snuff  . Tobacco comment: 04/30/2012 "smoked & used snuff maybe for 1 wk total"  Vaping Use  . Vaping Use: Never used  Substance and Sexual Activity  . Alcohol use: Yes    Alcohol/week: 10.0 standard drinks    Types: 10 Shots of liquor per week    Comment: Drinks a 5th daily  . Drug use: No  . Sexual activity: Yes    Birth control/protection: None  Other Topics Concern  . 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 Difficulty of Paying Living Expenses:   Food Insecurity:   . Worried About Programme researcher, broadcasting/film/videounning Out of Food in the Last Year:   . Baristaan Out of Food in the Last Year:   Transportation Needs:   . Lack  of Transportation (Medical):   Marland Kitchen Lack of Transportation (Non-Medical):   Physical Activity:   . Days of Exercise per Week:   . Minutes of Exercise per Session:   Stress:   . Feeling of Stress :   Social Connections:   . Frequency of Communication with Friends and Family:   . Frequency of Social Gatherings with Friends and Family:   . Attends Religious Services:   . Active Member of Clubs or Organizations:   . Attends Banker Meetings:   Marland Kitchen Marital Status:   Intimate Partner Violence:   . Fear of Current or Ex-Partner:   . Emotionally Abused:   Marland Kitchen Physically Abused:   . Sexually Abused:     Past Medical History, Surgical history, Social history, and Family history were reviewed and updated as appropriate.   Please see review of systems for further details on the patient's review from today.   Objective:   Physical Exam:  Wt 202 lb (91.6 kg)   BMI 26.29 kg/m   Physical Exam Constitutional:      General: He is not in acute distress. Musculoskeletal:        General: No deformity.  Neurological:     Mental Status: He is alert and oriented to person, place, and time.     Coordination: Coordination normal.  Psychiatric:        Attention and Perception: Attention and perception normal. He does not perceive auditory or visual hallucinations.        Mood and Affect: Mood normal. Mood is not anxious or depressed. Affect is not labile, blunt, angry or inappropriate.        Speech: Speech normal.        Behavior: Behavior normal.        Thought Content: Thought content normal. Thought content is not paranoid or delusional. Thought content does not include homicidal or suicidal ideation. Thought  content does not include homicidal or suicidal plan.        Cognition and Memory: Cognition and memory normal.        Judgment: Judgment normal.     Comments: Insight intact     Lab Review:     Component Value Date/Time   NA 140 11/09/2019 1842   K 4.1 11/09/2019 1842   CL 103 11/09/2019 1842   CO2 25 11/09/2019 1842   GLUCOSE 86 11/09/2019 1842   BUN 14 11/09/2019 1842   CREATININE 0.99 11/09/2019 1842   CREATININE 1.17 10/10/2019 1526   CALCIUM 8.9 11/09/2019 1842   PROT 6.2 11/25/2019 0724   ALBUMIN 4.3 11/25/2019 0724   AST 29 11/25/2019 0724   ALT 17 11/25/2019 0724   ALKPHOS 49 11/25/2019 0724   BILITOT 0.4 11/25/2019 0724   GFRNONAA >60 11/09/2019 1842   GFRAA >60 11/09/2019 1842       Component Value Date/Time   WBC 6.8 11/09/2019 1842   RBC 4.51 11/09/2019 1842   HGB 14.5 11/09/2019 1842   HCT 44.7 11/09/2019 1842   PLT 203 11/09/2019 1842   MCV 99.1 11/09/2019 1842   MCV 95.4 08/12/2011 1021   MCH 32.2 11/09/2019 1842   MCHC 32.4 11/09/2019 1842   RDW 12.4 11/09/2019 1842   LYMPHSABS 1,279 10/10/2019 1526   MONOABS 0.7 04/30/2012 1332   EOSABS 17 10/10/2019 1526   BASOSABS 26 10/10/2019 1526    No results found for: POCLITH, LITHIUM   No results found for: PHENYTOIN, PHENOBARB, VALPROATE, CBMZ   .res Assessment: Plan:  Discussed changing Hydroxyzine to 25 mg po QHS prn only for insomnia to possibly reduce medication side effects since pt reports having fatigue many afternoons. Agrees with plan to continue to reduce caffeine intake to minimize significant shifts in energy.  Continue Sertraline 100 mg po qd for mood and anxiety.  Continue Gabapentin 600 mg po QHS for anxiety and insomnia. Continue Trazodone 50 mg po QHS prn insomnia.  Pt to f/u in 4 weeks or sooner if clinically indicated.  Recommend continuing psychotherapy with Bradley Ferris, LPC. Patient advised to contact office with any questions, adverse effects, or acute worsening in signs and  symptoms.  Jyair was seen today for follow-up.  Diagnoses and all orders for this visit:  Social anxiety disorder -     sertraline (ZOLOFT) 100 MG tablet; Take 1 tablet (100 mg total) by mouth daily. -     gabapentin (NEURONTIN) 600 MG tablet; Take 1 tablet (600 mg total) by mouth at bedtime.  Severe recurrent major depression without psychotic features (HCC) -     sertraline (ZOLOFT) 100 MG tablet; Take 1 tablet (100 mg total) by mouth daily.  Insomnia, unspecified type -     gabapentin (NEURONTIN) 600 MG tablet; Take 1 tablet (600 mg total) by mouth at bedtime. -     traZODone (DESYREL) 50 MG tablet; Take 1 tablet (50 mg total) by mouth at bedtime as needed for sleep.     Please see After Visit Summary for patient specific instructions.  Future Appointments  Date Time Provider Department Center  02/17/2020  8:30 AM Corie Chiquito, PMHNP [facility] None  06/28/2020  9:00 AM Panosh, Neta Mends, MD [facility]    No orders of the defined types were placed in this encounter.   -------------------------------

## 2020-01-30 DIAGNOSIS — M545 Low back pain: Secondary | ICD-10-CM | POA: Diagnosis not present

## 2020-02-17 ENCOUNTER — Encounter: Payer: Self-pay | Admitting: Psychiatry

## 2020-02-17 ENCOUNTER — Ambulatory Visit (INDEPENDENT_AMBULATORY_CARE_PROVIDER_SITE_OTHER): Payer: BC Managed Care – PPO | Admitting: Psychiatry

## 2020-02-17 ENCOUNTER — Other Ambulatory Visit: Payer: Self-pay

## 2020-02-17 DIAGNOSIS — G4700 Insomnia, unspecified: Secondary | ICD-10-CM | POA: Diagnosis not present

## 2020-02-17 DIAGNOSIS — F4010 Social phobia, unspecified: Secondary | ICD-10-CM

## 2020-02-17 DIAGNOSIS — F332 Major depressive disorder, recurrent severe without psychotic features: Secondary | ICD-10-CM

## 2020-02-17 MED ORDER — TRAZODONE HCL 50 MG PO TABS: 50.0000 mg | ORAL_TABLET | Freq: Every evening | ORAL | 0 refills | Status: DC | PRN

## 2020-02-17 MED ORDER — GABAPENTIN 600 MG PO TABS: 600.0000 mg | ORAL_TABLET | Freq: Every day | ORAL | 0 refills | Status: DC

## 2020-02-17 MED ORDER — SERTRALINE HCL 100 MG PO TABS: 100.0000 mg | ORAL_TABLET | Freq: Every day | ORAL | 0 refills | Status: DC

## 2020-02-17 NOTE — Progress Notes (Signed)
Patrick NickJohn M Buitron [id number] 11-03-1985 34 y.o.  Subjective:   Patient ID:  Patrick Hamilton is a 34 y.o. (DOB 11-03-1985) male.  Chief Complaint:  Chief Complaint  Patient presents with  . Follow-up    h/o anxiety, depression, insomnia, ETOH dependence    HPI Patrick NickJohn M Steely presents to the office today for follow-up of anxiety, depression, and insomnia. "I think I might be turning a corner... I am feeling the best I have felt in a while." He reports that he feels more optimistic and clear minded. He reports that his motivation is good and is setting goals. Energy has improved upon awakening. No longer experiencing fatigue for the last 3 weeks. Sleeping well. Appetite has returned to normal after Prednisone. Reports that mood has been stable. Anxiety has been well controlled. Reports that he is less anxious in social interaction and no longer noticing "nervous energy." Reports that his concentration has been excellent. Denies SI.   He reports that he has a strong job lead. He reports that he has been trying to reach out to others and less withdrawn.  He reports that [facility]. Attending AA meetings about once a week. Denies ETOH cravings.   Past Psychiatric Medication Trials: Ambien- Took x 1 Gabapentin- Started a few weeks ago. Has been helpful for sleep. Sertraline Xanax Lorazepam Hydroxyzine Trazodone  GAD-7     Office Visit from 01/20/2020 in [facility] Office Visit from 10/30/2019 in [facility]  Total GAD-7 Score 0 3    PHQ2-9     Office Visit from 01/20/2020 in [facility] Office Visit from 10/30/2019 in [facility]  PHQ-2 Total Score 1 1 4   PHQ-9 Total Score -- 4 16       Review of Systems:  Review of Systems  Gastrointestinal: Negative.   Musculoskeletal: Positive for back pain. Negative for gait problem.   Neurological: Negative for tremors.  Psychiatric/Behavioral:       Please refer to HPI   Recent trial of Prednisone for back pain and noticed insomnia and increased appetite. He reports that he stopped Prednisone on the 3rd day. Back pain is improving.   Medications: I have reviewed the patient's current medications.  Current Outpatient Medications  Medication Sig Dispense Refill  . ARGININE PO Take by mouth.    . B Complex Vitamins (VITAMIN-B COMPLEX PO) Take by mouth.    . Cholecalciferol (VITAMIN D3) 250 MCG (10000 UT) capsule     . finasteride (PROPECIA) 1 MG tablet Take 1 tablet (1 mg total) by mouth daily. 30 tablet 4  . gabapentin (NEURONTIN) 600 MG tablet Take 1 tablet (600 mg total) by mouth at bedtime. 90 tablet 0  . Magnesium 500 MG CAPS Take by mouth.    Marland Kitchen. MAGNESIUM CHLORIDE PO Take 500 mg by mouth in the morning and at bedtime.    . medium chain triglycerides (MCT OIL) oil Take by mouth daily.    . Methylsulfonylmethane (MSM) 1000 MG CAPS Take 3,000 mg by mouth daily.    . Multiple Vitamin (MULTIVITAMIN WITH MINERALS) TABS Take 1 tablet by mouth daily.    . Omega-3 Fatty Acids (FISH OIL) 1000 MG CAPS Take by mouth.    . sertraline (ZOLOFT) 100 MG tablet Take 1 tablet (100 mg total) by mouth daily. 90 tablet 0  . SYNTHROID 100 MCG tablet Take 1 tablet (100 mcg total) by  mouth daily. 90 tablet 1  . traZODone (DESYREL) 50 MG tablet Take 1 tablet (50 mg total) by mouth at bedtime as needed for sleep. 90 tablet 0  . UNABLE TO FIND Wheatgrass Juice    . UNABLE TO FIND Vitamin K2 with MK-& and MK-4     No current facility-administered medications for this visit.    Medication Side Effects: None  Allergies: No Known Allergies  Past Medical History:  Diagnosis Date  . Acne   . Allergic rhinitis   . Compartment syndrome (HCC) 2010   right leg  . Depression   . Hypothyroidism   . Sever's disease     Family History  Problem Relation Age of Onset  . Prostate cancer  Father 36  . Autism Brother   . Diabetes Other   . Allergies Other   . Alcohol abuse Other        mom's side of the family  . Alcohol abuse Maternal Grandfather     Social History   Socioeconomic History  . Marital status: Single    Spouse name: Not on file  . Number of children: Not on file  . Years of education: Not on file  . Highest education level: Not on file  Occupational History  . Not on file  Tobacco Use  . Smoking status: Former Smoker    Types: Cigarettes    Quit date: 09/26/2019    Years since quitting: 0.4  . Smokeless tobacco: Former Neurosurgeon    Types: Snuff  . Tobacco comment: 04/30/2012 "smoked & used snuff maybe for 1 wk total"  Vaping Use  . Vaping Use: Never used  Substance and Sexual Activity  . Alcohol use: Yes    Alcohol/week: 10.0 standard drinks    Types: 10 Shots of liquor per week    Comment: Drinks a 5th daily  . Drug use: No  . Sexual activity: Yes    Birth control/protection: None  Other Topics Concern  . 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 Difficulty of Paying Living Expenses: Not on file  Food Insecurity:   . Worried About Programme researcher, broadcasting/film/video in the Last Year: Not on file  . Ran Out of Food in the Last Year: Not on file  Transportation Needs:   . Lack of Transportation (Medical): Not on file  . Lack of Transportation (Non-Medical): Not on file  Physical Activity:   . Days of Exercise per Week: Not on file  . Minutes of Exercise per Session: Not on file  Stress:   . Feeling of Stress : Not on file  Social Connections:   . Frequency of Communication with Friends and Family: Not on file  . Frequency of Social Gatherings with Friends and Family: Not on file  . Attends Religious Services: Not on file  .  Active Member of Clubs or Organizations: Not on file  . Attends Banker Meetings: Not on file  . Marital Status: Not on file  Intimate Partner Violence:   . Fear of Current or Ex-Partner: Not on file  . Emotionally Abused: Not on file  . Physically Abused: Not on file  . Sexually Abused: Not on  file    Past Medical History, Surgical history, Social history, and Family history were reviewed and updated as appropriate.   Please see review of systems for further details on the patient's review from today.   Objective:   Physical Exam:  There were no vitals taken for this visit.  Physical Exam Constitutional:      General: He is not in acute distress. Musculoskeletal:        General: No deformity.  Neurological:     Mental Status: He is alert and oriented to person, place, and time.     Coordination: Coordination normal.  Psychiatric:        Attention and Perception: Attention and perception normal. He does not perceive auditory or visual hallucinations.        Mood and Affect: Mood normal. Mood is not anxious or depressed. Affect is not labile, blunt, angry or inappropriate.        Speech: Speech normal.        Behavior: Behavior normal.        Thought Content: Thought content normal. Thought content is not paranoid or delusional. Thought content does not include homicidal or suicidal ideation. Thought content does not include homicidal or suicidal plan.        Cognition and Memory: Cognition and memory normal.        Judgment: Judgment normal.     Comments: Insight intact     Lab Review:     Component Value Date/Time   NA 140 11/09/2019 1842   K 4.1 11/09/2019 1842   CL 103 11/09/2019 1842   CO2 25 11/09/2019 1842   GLUCOSE 86 11/09/2019 1842   BUN 14 11/09/2019 1842   CREATININE 0.99 11/09/2019 1842   CREATININE 1.17 10/10/2019 1526   CALCIUM 8.9 11/09/2019 1842   PROT 6.2 11/25/2019 0724   ALBUMIN 4.3 11/25/2019 0724   AST 29 11/25/2019 0724   ALT 17  11/25/2019 0724   ALKPHOS 49 11/25/2019 0724   BILITOT 0.4 11/25/2019 0724   GFRNONAA >60 11/09/2019 1842   GFRAA >60 11/09/2019 1842       Component Value Date/Time   WBC 6.8 11/09/2019 1842   RBC 4.51 11/09/2019 1842   HGB 14.5 11/09/2019 1842   HCT 44.7 11/09/2019 1842   PLT 203 11/09/2019 1842   MCV 99.1 11/09/2019 1842   MCV 95.4 08/12/2011 1021   MCH 32.2 11/09/2019 1842   MCHC 32.4 11/09/2019 1842   RDW 12.4 11/09/2019 1842   LYMPHSABS 1,279 10/10/2019 1526   MONOABS 0.7 04/30/2012 1332   EOSABS 17 10/10/2019 1526   BASOSABS 26 10/10/2019 1526    No results found for: POCLITH, LITHIUM   No results found for: PHENYTOIN, PHENOBARB, VALPROATE, CBMZ   .res Assessment: Plan:   Will continue current plan of care since target signs and symptoms are well controlled without any tolerability issues. Pt to f/u in 4 weeks or sooner if clinically indicated.  Patient advised to contact office with any questions, adverse effects, or acute worsening in signs and symptoms.  Ziere was seen today for follow-up.  Diagnoses and all orders for this visit:  Social anxiety disorder -     gabapentin (NEURONTIN) 600 MG tablet; Take 1 tablet (600 mg total) by mouth at bedtime. -     sertraline (ZOLOFT) 100 MG tablet; Take 1 tablet (100 mg total) by mouth daily.  Insomnia, unspecified type -     gabapentin (NEURONTIN) 600 MG tablet; Take 1 tablet (600  mg total) by mouth at bedtime. -     traZODone (DESYREL) 50 MG tablet; Take 1 tablet (50 mg total) by mouth at bedtime as needed for sleep.  Severe recurrent major depression without psychotic features (HCC) -     sertraline (ZOLOFT) 100 MG tablet; Take 1 tablet (100 mg total) by mouth daily.     Please see After Visit Summary for patient specific instructions.  Future Appointments  Date Time Provider Department Center  03/15/2020  8:30 AM Corie Chiquito, PMHNP [facility] None  06/28/2020  9:00 AM Panosh, Neta Mends, MD [facility]    No  orders of the defined types were placed in this encounter.   -------------------------------

## 2020-02-23 DIAGNOSIS — M545 Low back pain: Secondary | ICD-10-CM | POA: Diagnosis not present

## 2020-03-02 ENCOUNTER — Telehealth (INDEPENDENT_AMBULATORY_CARE_PROVIDER_SITE_OTHER): Payer: BC Managed Care – PPO | Admitting: Internal Medicine

## 2020-03-02 ENCOUNTER — Encounter: Payer: Self-pay | Admitting: Internal Medicine

## 2020-03-02 ENCOUNTER — Other Ambulatory Visit: Payer: Self-pay

## 2020-03-02 VITALS — BP 132/72 | HR 94 | Temp 99.3°F | Ht 73.5 in | Wt 195.0 lb

## 2020-03-02 DIAGNOSIS — L089 Local infection of the skin and subcutaneous tissue, unspecified: Secondary | ICD-10-CM | POA: Diagnosis not present

## 2020-03-02 DIAGNOSIS — R197 Diarrhea, unspecified: Secondary | ICD-10-CM | POA: Diagnosis not present

## 2020-03-02 DIAGNOSIS — L729 Follicular cyst of the skin and subcutaneous tissue, unspecified: Secondary | ICD-10-CM | POA: Diagnosis not present

## 2020-03-02 MED ORDER — DOXYCYCLINE HYCLATE 100 MG PO TABS: 100.0000 mg | ORAL_TABLET | Freq: Two times a day (BID) | ORAL | 0 refills | Status: AC

## 2020-03-02 NOTE — Telephone Encounter (Signed)
Advise visit   We could do a virtual ( ? Add on 430?) and begin antibiotics and then in person in case needs    Incision and drainage

## 2020-03-02 NOTE — Progress Notes (Signed)
Virtual Visit via Video Note  I connected with@ on 03/02/20 at  4:30 PM EDT by a video enabled telemedicine application and verified that I am speaking with the correct person using two identifiers. Location patient: home Location provider:work  office Persons participating in the virtual visit: patient, provider  [place] national recommendations  regarding COVID 19 pandemic   video visit is advised over in office visit for this patient.  Patient aware  of the limitations of evaluation and management by telemedicine and  availability of in person appointments. and agreed to proceed.   HPI: Patrick Hamilton presents for video visit. come in for   2 weeks  pimple feeling  And has been popped  A few times  And  His mom sticked   Open the area .   White stuff comes out.    Combo.  squeezing it    And out .  Some pus. And blood    The same for a week.   Temp 100.8 and  99.2   Had diarrhea  This am and headache.     Although not feeling right all week?   Ate little today but fluids ok .  No cough    ROS: See pertinent positives and negatives per HPI. No vomiting  Rest of family ok  Is vaccinated   Past Medical History:  Diagnosis Date  . Acne   . Allergic rhinitis   . Compartment syndrome (HCC) 2010   right leg  . Depression   . Hypothyroidism   . Sever's disease     Past Surgical History:  Procedure Laterality Date  . compartment syndrome release  2010   RLE  . ED visit   2011   [place] for Alcohol related problem  . GROWTH PLATE SURGERY  ~ 2000   right foot  . LUMBAR LAMINECTOMY/DECOMPRESSION MICRODISCECTOMY  05/02/2012   Procedure: LUMBAR LAMINECTOMY/DECOMPRESSION MICRODISCECTOMY;  Surgeon: Mat Carne, MD;  Location: [facility] OR;  Service: Orthopedics;  Laterality: Right;  lumbar five-sacral one laminotomy and disc excision right side  . nose fracture    . RHINOPLASTY  1999  . TONSILLECTOMY AND ADENOIDECTOMY  2003    Family History  Problem Relation Age of Onset  .  Prostate cancer Father 74  . Autism Brother   . Diabetes Other   . Allergies Other   . Alcohol abuse Other        mom's side of the family  . Alcohol abuse Maternal Grandfather     Social History   Tobacco Use  . Smoking status: Former Smoker    Types: Cigarettes    Quit date: 09/26/2019    Years since quitting: 0.4  . Smokeless tobacco: Former Neurosurgeon    Types: Snuff  . Tobacco comment: 04/30/2012 "smoked & used snuff maybe for 1 wk total"  Vaping Use  . Vaping Use: Never used  Substance Use Topics  . Alcohol use: Yes    Alcohol/week: 10.0 standard drinks    Types: 10 Shots of liquor per week    Comment: Drinks a 5th daily  . Drug use: No      Current Outpatient Medications:  .  ARGININE PO, Take by mouth., Disp: , Rfl:  .  B Complex Vitamins (VITAMIN-B COMPLEX PO), Take by mouth., Disp: , Rfl:  .  Cholecalciferol (VITAMIN D3) 250 MCG (10000 UT) capsule, , Disp: , Rfl:  .  finasteride (PROPECIA) 1 MG tablet, Take 1 tablet (1 mg total) by mouth daily.,  Disp: 30 tablet, Rfl: 4 .  gabapentin (NEURONTIN) 600 MG tablet, Take 1 tablet (600 mg total) by mouth at bedtime., Disp: 90 tablet, Rfl: 0 .  Magnesium 500 MG CAPS, Take by mouth., Disp: , Rfl:  .  MAGNESIUM CHLORIDE PO, Take 500 mg by mouth in the morning and at bedtime., Disp: , Rfl:  .  medium chain triglycerides (MCT OIL) oil, Take by mouth daily., Disp: , Rfl:  .  Methylsulfonylmethane (MSM) 1000 MG CAPS, Take 3,000 mg by mouth daily., Disp: , Rfl:  .  Multiple Vitamin (MULTIVITAMIN WITH MINERALS) TABS, Take 1 tablet by mouth daily., Disp: , Rfl:  .  Omega-3 Fatty Acids (FISH OIL) 1000 MG CAPS, Take by mouth., Disp: , Rfl:  .  sertraline (ZOLOFT) 100 MG tablet, Take 1 tablet (100 mg total) by mouth daily., Disp: 90 tablet, Rfl: 0 .  SYNTHROID 100 MCG tablet, Take 1 tablet (100 mcg total) by mouth daily., Disp: 90 tablet, Rfl: 1 .  traZODone (DESYREL) 50 MG tablet, Take 1 tablet (50 mg total) by mouth at bedtime as needed  for sleep., Disp: 90 tablet, Rfl: 0 .  UNABLE TO FIND, Wheatgrass Juice, Disp: , Rfl:  .  UNABLE TO FIND, Vitamin K2 with MK-& and MK-4, Disp: , Rfl:  .  doxycycline (VIBRA-TABS) 100 MG tablet, Take 1 tablet (100 mg total) by mouth 2 (two) times daily., Disp: 14 tablet, Rfl: 0  EXAM: BP Readings from Last 3 Encounters:  03/02/20 132/72  12/26/19 120/72  11/09/19 (!) 147/67    VITALS per patient if applicable:  GENERAL: alert, oriented, appears well and in no acute distress non toxic    HEENT: atraumatic, conjunttiva clear, no obvious abnormalities on inspection of external nose and ears NECK: normal movements of the head and neck LUNGS: on inspection no signs of respiratory distress, breathing rate appears normal, no obvious gross SOB, gasping or wheezing CV: no obvious cyanosis See picture    Of lesion  On my chart   PSYCH/NEURO: pleasant and cooperative, no obvious depression or anxiety, speech and thought processing grossly intact Lab Results  Component Value Date   WBC 6.8 11/09/2019   HGB 14.5 11/09/2019   HCT 44.7 11/09/2019   PLT 203 11/09/2019   GLUCOSE 86 11/09/2019   CHOL 147 11/10/2019   TRIG 99 11/10/2019   HDL 32 (L) 11/10/2019   LDLCALC 95 11/10/2019   ALT 17 11/25/2019   AST 29 11/25/2019   NA 140 11/09/2019   K 4.1 11/09/2019   CL 103 11/09/2019   CREATININE 0.99 11/09/2019   BUN 14 11/09/2019   CO2 25 11/09/2019   TSH 2.05 11/25/2019   PSA 0.97 08/12/2011   INR 1.18 04/30/2012   HGBA1C 5.0 11/10/2019    ASSESSMENT AND PLAN:  Discussed the following assessment and plan:    ICD-10-CM   1. Local skin infection  L08.9   2. Infected cyst of skin ?  L72.9    L08.9   3. Diarrhea of presumed infectious origin  R19.7    fever  headache  one day    It doesn't seem like the fever ha diarrhea is from the skin area but  Cannot tell  For sure  Begin antibiotic  With gi precautions stay hydrated   Get covid 19 tested  And plan fu in person  On Friday 10  as indicated  Counseled.   Expectant management and discussion of plan and treatment with opportunity to ask questions and  all were answered. The patient agreed with the plan and demonstrated an understanding of the instructions.   Advised to call back or seek an in-person evaluation if worsening  or having  further concerns . Review counsel  Plans  No follow-ups on file.    Berniece Andreas, MD

## 2020-03-03 ENCOUNTER — Other Ambulatory Visit: Payer: BC Managed Care – PPO

## 2020-03-03 ENCOUNTER — Other Ambulatory Visit: Payer: Self-pay

## 2020-03-03 DIAGNOSIS — Z20822 Contact with and (suspected) exposure to covid-19: Secondary | ICD-10-CM

## 2020-03-05 ENCOUNTER — Other Ambulatory Visit: Payer: Self-pay

## 2020-03-05 ENCOUNTER — Telehealth (INDEPENDENT_AMBULATORY_CARE_PROVIDER_SITE_OTHER): Payer: BC Managed Care – PPO | Admitting: Internal Medicine

## 2020-03-05 ENCOUNTER — Encounter: Payer: Self-pay | Admitting: Internal Medicine

## 2020-03-05 VITALS — Ht 73.5 in | Wt 205.0 lb

## 2020-03-05 DIAGNOSIS — R509 Fever, unspecified: Secondary | ICD-10-CM | POA: Diagnosis not present

## 2020-03-05 DIAGNOSIS — L089 Local infection of the skin and subcutaneous tissue, unspecified: Secondary | ICD-10-CM

## 2020-03-05 MED ORDER — ONDANSETRON 4 MG PO TBDP: 4.0000 mg | ORAL_TABLET | Freq: Three times a day (TID) | ORAL | 1 refills | Status: AC | PRN

## 2020-03-05 NOTE — Progress Notes (Signed)
Virtual Visit via Video Note  I connected with@ on 03/05/20 at  2:00 PM EDT by a video enabled telemedicine application and verified that I am speaking with the correct person using two identifiers. Location patient: home Location provider:work  office Persons participating in the virtual visit: patient, provider  [place] national recommendations  regarding COVID 19 pandemic   video visit is advised over in office visit for this patient.  Patient aware  of the limitations of evaluation and management by telemedicine and  availability of in person appointments. and agreed to proceed.   HPI: Patrick Hamilton presents for video visit  Fu  For ha low grade fever nausea and skin infection poss infected cyst . covid testing is pending had temp 101 9 7 abd 8  But ok in day  Ongoing headache  Intermittent vomiting but today  No fever  And HA subsiding   Taking fluids  Able tot tolerated antibiotic and is getting better  No longer mass effect  ( gold ball ) described in past and now flat and  Getting better  No one else is sick no tick bites rashes   If all ok may go to beach trip from this weekend.   ROS: See pertinent positives and negatives per HPI. No cough minor nose congestion   Past Medical History:  Diagnosis Date  . Acne   . Allergic rhinitis   . Compartment syndrome (HCC) 2010   right leg  . Depression   . Hypothyroidism   . Sever's disease     Past Surgical History:  Procedure Laterality Date  . compartment syndrome release  2010   RLE  . ED visit   2011   [place] for Alcohol related problem  . GROWTH PLATE SURGERY  ~ 2000   right foot  . LUMBAR LAMINECTOMY/DECOMPRESSION MICRODISCECTOMY  05/02/2012   Procedure: LUMBAR LAMINECTOMY/DECOMPRESSION MICRODISCECTOMY;  Surgeon: Mat Carne, MD;  Location: [facility] OR;  Service: Orthopedics;  Laterality: Right;  lumbar five-sacral one laminotomy and disc excision right side  . nose fracture    . RHINOPLASTY  1999  . TONSILLECTOMY AND  ADENOIDECTOMY  2003    Family History  Problem Relation Age of Onset  . Prostate cancer Father 71  . Autism Brother   . Diabetes Other   . Allergies Other   . Alcohol abuse Other        mom's side of the family  . Alcohol abuse Maternal Grandfather     Social History   Tobacco Use  . Smoking status: Former Smoker    Types: Cigarettes    Quit date: 09/26/2019    Years since quitting: 0.4  . Smokeless tobacco: Former Neurosurgeon    Types: Snuff  . Tobacco comment: 04/30/2012 "smoked & used snuff maybe for 1 wk total"  Vaping Use  . Vaping Use: Never used  Substance Use Topics  . Alcohol use: Yes    Alcohol/week: 10.0 standard drinks    Types: 10 Shots of liquor per week    Comment: Drinks a 5th daily  . Drug use: No      Current Outpatient Medications:  .  ARGININE PO, Take by mouth., Disp: , Rfl:  .  B Complex Vitamins (VITAMIN-B COMPLEX PO), Take by mouth., Disp: , Rfl:  .  Cholecalciferol (VITAMIN D3) 250 MCG (10000 UT) capsule, , Disp: , Rfl:  .  doxycycline (VIBRA-TABS) 100 MG tablet, Take 1 tablet (100 mg total) by mouth 2 (two) times daily., Disp:  14 tablet, Rfl: 0 .  finasteride (PROPECIA) 1 MG tablet, Take 1 tablet (1 mg total) by mouth daily., Disp: 30 tablet, Rfl: 4 .  gabapentin (NEURONTIN) 600 MG tablet, Take 1 tablet (600 mg total) by mouth at bedtime., Disp: 90 tablet, Rfl: 0 .  Magnesium 500 MG CAPS, Take by mouth., Disp: , Rfl:  .  MAGNESIUM CHLORIDE PO, Take 500 mg by mouth in the morning and at bedtime., Disp: , Rfl:  .  medium chain triglycerides (MCT OIL) oil, Take by mouth daily., Disp: , Rfl:  .  Methylsulfonylmethane (MSM) 1000 MG CAPS, Take 3,000 mg by mouth daily., Disp: , Rfl:  .  Multiple Vitamin (MULTIVITAMIN WITH MINERALS) TABS, Take 1 tablet by mouth daily., Disp: , Rfl:  .  Omega-3 Fatty Acids (FISH OIL) 1000 MG CAPS, Take by mouth., Disp: , Rfl:  .  sertraline (ZOLOFT) 100 MG tablet, Take 1 tablet (100 mg total) by mouth daily., Disp: 90 tablet,  Rfl: 0 .  SYNTHROID 100 MCG tablet, Take 1 tablet (100 mcg total) by mouth daily., Disp: 90 tablet, Rfl: 1 .  traZODone (DESYREL) 50 MG tablet, Take 1 tablet (50 mg total) by mouth at bedtime as needed for sleep., Disp: 90 tablet, Rfl: 0 .  UNABLE TO FIND, Wheatgrass Juice, Disp: , Rfl:  .  UNABLE TO FIND, Vitamin K2 with MK-& and MK-4, Disp: , Rfl:  .  ondansetron (ZOFRAN-ODT) 4 MG disintegrating tablet, Take 1 tablet (4 mg total) by mouth every 8 (eight) hours as needed for nausea or vomiting., Disp: 20 tablet, Rfl: 1  EXAM: BP Readings from Last 3 Encounters:  03/02/20 132/72  12/26/19 120/72  11/09/19 (!) 147/67    VITALS per patient if applicable:  GENERAL: alert, oriented, appears well and in no acute distress HEENT: atraumatic, conjunttiva clear, no obvious abnormalities on inspection of external nose and ears NECK: normal movements of the head and neck LUNGS: on inspection no signs of respiratory distress, breathing rate appears normal, no obvious gross SOB, gasping or wheezing Skin  Back  less swelling and pink fading  No dc   Or obv mass  CV: no obvious cyanosis MS: moves all visible extremities without noticeable abnormality PSYCH/NEURO: pleasant and cooperative, no obvious depression or anxiety, speech and thought processing grossly intact Lab Results  Component Value Date   WBC 6.8 11/09/2019   HGB 14.5 11/09/2019   HCT 44.7 11/09/2019   PLT 203 11/09/2019   GLUCOSE 86 11/09/2019   CHOL 147 11/10/2019   TRIG 99 11/10/2019   HDL 32 (L) 11/10/2019   LDLCALC 95 11/10/2019   ALT 17 11/25/2019   AST 29 11/25/2019   NA 140 11/09/2019   K 4.1 11/09/2019   CL 103 11/09/2019   CREATININE 0.99 11/09/2019   BUN 14 11/09/2019   CO2 25 11/09/2019   TSH 2.05 11/25/2019   PSA 0.97 08/12/2011   INR 1.18 04/30/2012   HGBA1C 5.0 11/10/2019    ASSESSMENT AND PLAN:  Discussed the following assessment and plan:    ICD-10-CM   1. Local skin infection  L08.9   2. Acute  febrile illness  R50.9    w HA nausea and vomiting  improving    Fever headache resolving   w and wo vomiting    On doxy  covid testing pending  Local skin infection vs cyst appears improved dosent seem cause of above  But uncertain    Getting better  Continue medication and zofran if  needed   Expectant management. And plans ofn travle to beach if covid negative  ( family not sick)  Can fu about skin area if  persistent or progressive when convenient or  [place]  Uncertain if needs further .  Counseled.   Expectant management and discussion of plan and treatment with opportunity to ask questions and all were answered. The patient agreed with the plan and demonstrated an understanding of the instructions.   Advised to call back or seek an in-person evaluation if worsening  or having  further concerns . Return if symptoms worsen or fail to improve as expected.     Or relapsing sx     Berniece Andreas, MD

## 2020-03-06 LAB — NOVEL CORONAVIRUS, NAA: SARS-CoV-2, NAA: NOT DETECTED

## 2020-03-15 ENCOUNTER — Ambulatory Visit (INDEPENDENT_AMBULATORY_CARE_PROVIDER_SITE_OTHER): Payer: BC Managed Care – PPO | Admitting: Psychiatry

## 2020-03-15 ENCOUNTER — Encounter: Payer: Self-pay | Admitting: Psychiatry

## 2020-03-15 ENCOUNTER — Other Ambulatory Visit: Payer: Self-pay

## 2020-03-15 DIAGNOSIS — G4700 Insomnia, unspecified: Secondary | ICD-10-CM | POA: Diagnosis not present

## 2020-03-15 DIAGNOSIS — F4010 Social phobia, unspecified: Secondary | ICD-10-CM

## 2020-03-15 DIAGNOSIS — F339 Major depressive disorder, recurrent, unspecified: Secondary | ICD-10-CM

## 2020-03-15 NOTE — Progress Notes (Signed)
KHOA OPDAHL [id number] February 15, 1986 34 y.o.  Subjective:   Patient ID:  Patrick Hamilton is a 34 y.o. (DOB 08-Oct-1985) male.  Chief Complaint:  Chief Complaint  Patient presents with  . Follow-up    History of anxiety, depression, sleep disturbance, and alcohol misuse    HPI Patrick Hamilton presents to the office today for follow-up of anxiety, depression, insomnia, and ETOH misuse. He reports that he had a "setback" with some depression and isolation for about a week after not receiving a job offer that he had hoped. Denies relapse or any substance use. Denies cravings to drink.  Reports that mood improved while on vacation last week and is now back to baseline. Had some anxiety when he learned his job application was not going to be moved forward for consideration. He reports anxiety has been better today. He reports that sleep was altered during week of depressive s/s with staying up later and sleeping later. He reports that sleep is now back on track and improved, aside from sleep disturbance last night. Typically goes to bed at 9 and sleeps until 6 am. Appetite has been ok. Energy and motivation were lower with week of depression and have returned to baseline. He reports that concentration has been good and was networking some this morning. Denies anhedonia. Denies feeling socially withdrawn. 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 He reports that feelings of "shame" prevent him from socializing more. Denies SI.   Has not been seeing Bradley Ferris, [facility] or attend AA.   Past Psychiatric Medication Trials: Ambien- Took x 1 Gabapentin- Started a few weeks ago. Has been helpful for sleep. Sertraline Xanax Lorazepam Hydroxyzine Trazodone  GAD-7     Office Visit from 01/20/2020 in [facility] Office Visit from 10/30/2019 in [facility]  Total GAD-7 Score 0 3    PHQ2-9     Office Visit from  01/20/2020 in [facility] Office Visit from 10/30/2019 in [facility] Office Visit from 10/10/2019 in [facility] [place]  PHQ-2 Total Score 1 1 4   PHQ-9 Total Score -- 4 16       Review of Systems:  Review of Systems  Gastrointestinal: Negative.   Musculoskeletal: Negative for gait problem.  Neurological: Negative for tremors and headaches.  Psychiatric/Behavioral:       Please refer to HPI   Reports acute illness about 2 weeks ago with HA, Nausea, fever, and chills. COVID test was negative. He reports that physical s/s have fully resolved.   Medications: Reviewed and reconciled  Current Outpatient Medications  Medication Sig Dispense Refill  . ARGININE PO Take by mouth.    . B Complex Vitamins (VITAMIN-B COMPLEX PO) Take by mouth.    . Cholecalciferol (VITAMIN D3) 250 MCG (10000 UT) capsule     . doxycycline (VIBRA-TABS) 100 MG tablet Take 1 tablet (100 mg total) by mouth 2 (two) times daily. 14 tablet 0  . finasteride (PROPECIA) 1 MG tablet Take 1 tablet (1 mg total) by mouth daily. 30 tablet 4  . gabapentin (NEURONTIN) 600 MG tablet Take 1 tablet (600 mg total) by mouth at bedtime. 90 tablet 0  . Magnesium 500 MG CAPS Take by mouth.    MAGNESIUM CHLORIDE PO Take 500 mg by mouth in the morning and at bedtime.    . medium chain triglycerides (MCT OIL) oil Take by mouth daily.    . Methylsulfonylmethane (  MSM) 1000 MG CAPS Take 3,000 mg by mouth daily.    . Multiple Vitamin (MULTIVITAMIN WITH MINERALS) TABS Take 1 tablet by mouth daily.    . Omega-3 Fatty Acids (FISH OIL) 1000 MG CAPS Take by mouth.    . ondansetron (ZOFRAN-ODT) 4 MG disintegrating tablet Take 1 tablet (4 mg total) by mouth every 8 (eight) hours as needed for nausea or vomiting. 20 tablet 1  . sertraline (ZOLOFT) 100 MG tablet Take 1 tablet (100 mg total) by mouth daily. 90 tablet 0  . SYNTHROID 100 MCG tablet Take 1 tablet (100 mcg total) by mouth daily. 90 tablet 1  .  traZODone (DESYREL) 50 MG tablet Take 1 tablet (50 mg total) by mouth at bedtime as needed for sleep. 90 tablet 0  . UNABLE TO FIND Wheatgrass Juice    . UNABLE TO FIND Vitamin K2 with MK-& and MK-4     No current facility-administered medications for this visit.    Medication Side Effects: None  Allergies: No Known Allergies  Past Medical History:  Diagnosis Date  . Acne   . Allergic rhinitis   . Compartment syndrome (HCC) 2010   right leg  . Depression   . Hypothyroidism   . Sever's disease     Family History  Problem Relation Age of Onset  . Prostate cancer Father 19  . Autism Brother   . Diabetes Other   . Allergies Other   . Alcohol abuse Other        mom's side of the family  . Alcohol abuse Maternal Grandfather     Social History   Socioeconomic History  . Marital status: Single    Spouse name: Not on file  . Number of children: Not on file  . Years of education: Not on file  . Highest education level: Not on file  Occupational History  . Not on file  Tobacco Use  . Smoking status: Former Smoker    Types: Cigarettes    Quit date: 09/26/2019    Years since quitting: 0.4  . Smokeless tobacco: Former Neurosurgeon    Types: Snuff  . Tobacco comment: 04/30/2012 "smoked & used snuff maybe for 1 wk total"  Vaping Use  . Vaping Use: Never used  Substance and Sexual Activity  . Alcohol use: Yes    Alcohol/week: 10.0 standard drinks    Types: 10 Shots of liquor per week    Comment: Drinks a 5th daily  . Drug use: No  . Sexual activity: Yes    Birth control/protection: None  Other Topics Concern  . 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 Difficulty of Paying Living Expenses: Not on file  Food Insecurity:   . Worried About Patent examiner in the Last Year: Not on file  . Ran Out of Food in the Last Year: Not on file  Transportation Needs:   . Lack of Transportation (Medical): Not on file  . Lack of Transportation (Non-Medical): Not on file  Physical Activity:   . Days of Exercise per Week: Not on file  . Minutes of Exercise per Session: Not on file  Stress:   . Feeling of Stress :  Not on file  Social Connections:   . Frequency of Communication with Friends and Family: Not on file  . Frequency of Social Gatherings with Friends and Family: Not on file  . Attends Religious Services: Not on file  . Active Member of Clubs or Organizations: Not on file  . Attends Banker Meetings: Not on file  . Marital Status: Not on file  Intimate Partner Violence:   . Fear of Current or Ex-Partner: Not on file  . Emotionally Abused: Not on file  . Physically Abused: Not on file  . Sexually Abused: Not on file    Past Medical History, Surgical history, Social history, and Family history were reviewed and updated as appropriate.   Please see review of systems for further details on the patient's review from today.   Objective:   Physical Exam:  There were no vitals taken for this visit.  Physical Exam Constitutional:      General: He is not in acute distress. Musculoskeletal:        General: No deformity.  Neurological:     Mental Status: He is alert and oriented to person, place, and time.     Coordination: Coordination normal.  Psychiatric:        Attention and Perception: Attention and perception normal. He does not perceive auditory or visual hallucinations.        Mood and Affect: Mood normal. Mood is not anxious or depressed. Affect is not labile, blunt, angry or inappropriate.        Speech: Speech normal.        Behavior: Behavior normal.        Thought Content: Thought content normal. Thought content is not paranoid or delusional. Thought content does not include homicidal or suicidal ideation.  Thought content does not include homicidal or suicidal plan.        Cognition and Memory: Cognition and memory normal.        Judgment: Judgment normal.     Comments: Insight intact     Lab Review:     Component Value Date/Time   NA 140 11/09/2019 1842   K 4.1 11/09/2019 1842   CL 103 11/09/2019 1842   CO2 25 11/09/2019 1842   GLUCOSE 86 11/09/2019 1842   BUN 14 11/09/2019 1842   CREATININE 0.99 11/09/2019 1842   CREATININE 1.17 10/10/2019 1526   CALCIUM 8.9 11/09/2019 1842   PROT 6.2 11/25/2019 0724   ALBUMIN 4.3 11/25/2019 0724   AST 29 11/25/2019 0724   ALT 17 11/25/2019 0724   ALKPHOS 49 11/25/2019 0724   BILITOT 0.4 11/25/2019 0724   GFRNONAA >60 11/09/2019 1842   GFRAA >60 11/09/2019 1842       Component Value Date/Time   WBC 6.8 11/09/2019 1842   RBC 4.51 11/09/2019 1842   HGB 14.5 11/09/2019 1842   HCT 44.7 11/09/2019 1842   PLT 203 11/09/2019 1842   MCV 99.1 11/09/2019 1842   MCV 95.4 08/12/2011 1021   MCH 32.2 11/09/2019 1842   MCHC 32.4 11/09/2019 1842   RDW 12.4 11/09/2019 1842   LYMPHSABS 1,279 10/10/2019 1526   MONOABS 0.7 04/30/2012 1332   EOSABS 17 10/10/2019 1526   BASOSABS 26 10/10/2019 1526    No results found for: POCLITH, LITHIUM   No results found for: PHENYTOIN, PHENOBARB, VALPROATE, CBMZ   .res Assessment: Plan:   Patient seen for 30 minutes and time spent counseling patient regarding long-term safety and side effects of medications.  Discussed  that a dose reduction in medications is not indicated at this time.  Discussed that recent recurrence of depressive signs and symptoms seem to have been directly in response to and identifiable trigger and was short in duration.  Discussed possibly considering increasing sertraline if he experienced more frequent recurrent signs and symptoms or if signs and symptoms for longer in duration and affecting function.  Encouraged patient to consider becoming active in AA meetings again and also resuming  therapy. Continue sertraline 100 mg daily for anxiety and depression. Continue gabapentin 600 mg at bedtime for anxiety and insomnia. Continue trazodone 50 mg at bedtime as needed for insomnia. Patient to follow-up in 4 weeks or sooner if clinically indicated. Patient advised to contact office with any questions, adverse effects, or acute worsening in signs and symptoms.  Tremon was seen today for follow-up.  Diagnoses and all orders for this visit:  Major depression, recurrent, chronic (HCC)  Social anxiety disorder  Insomnia, unspecified type     Please see After Visit Summary for patient specific instructions.  Future Appointments  Date Time Provider Department Center  04/19/2020  8:00 AM Corie Chiquitoarter, Valli Randol, PMHNP [facility] None  06/28/2020  9:00 AM Panosh, Neta MendsWanda K, MD [id number]    No orders of the defined types were placed in this encounter.   -------------------------------

## 2020-04-10 ENCOUNTER — Other Ambulatory Visit: Payer: Self-pay | Admitting: Internal Medicine

## 2020-04-19 ENCOUNTER — Other Ambulatory Visit: Payer: Self-pay

## 2020-04-19 ENCOUNTER — Encounter: Payer: Self-pay | Admitting: Psychiatry

## 2020-04-19 ENCOUNTER — Ambulatory Visit (INDEPENDENT_AMBULATORY_CARE_PROVIDER_SITE_OTHER): Payer: BC Managed Care – PPO | Admitting: Psychiatry

## 2020-04-19 DIAGNOSIS — F3342 Major depressive disorder, recurrent, in full remission: Secondary | ICD-10-CM | POA: Diagnosis not present

## 2020-04-19 DIAGNOSIS — F332 Major depressive disorder, recurrent severe without psychotic features: Secondary | ICD-10-CM

## 2020-04-19 DIAGNOSIS — G4700 Insomnia, unspecified: Secondary | ICD-10-CM | POA: Diagnosis not present

## 2020-04-19 DIAGNOSIS — F4010 Social phobia, unspecified: Secondary | ICD-10-CM | POA: Diagnosis not present

## 2020-04-19 MED ORDER — TRAZODONE HCL 50 MG PO TABS: 50.0000 mg | ORAL_TABLET | Freq: Every evening | ORAL | 0 refills | Status: AC | PRN

## 2020-04-19 MED ORDER — GABAPENTIN 600 MG PO TABS: 600.0000 mg | ORAL_TABLET | Freq: Every day | ORAL | 0 refills | Status: AC

## 2020-04-19 MED ORDER — SERTRALINE HCL 100 MG PO TABS: 100.0000 mg | ORAL_TABLET | Freq: Every day | ORAL | 0 refills | Status: AC

## 2020-04-19 NOTE — Progress Notes (Addendum)
Patrick Hamilton [id number] June 05, 1986 34 y.o.  Subjective:   Patient ID:  Patrick Hamilton is a 34 y.o. (DOB 1986-04-20) male.  Chief Complaint:  Chief Complaint  Patient presents with  . Follow-up    HPI Patrick Hamilton presents to the office today for follow-up of ETOH dependence, anxiety, depression, and insomnia.  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 Moving Friday to start new job in [place], [place]. Has a family friend that will be nearby. He reports that he has not seen Bradley Ferris, [facility] or attended AA recently.   Denies depressed mood or irritability. Denies anxiety. Sleep has been ok overall with occasional nights of disrupted sleep. Appetite has been good. Recently completed a 48 hour fast and has been doing some intermittent fasting. Energy and motivation have been good. Concentration has been adequate. Denies SI.   Last drink May 16th. Denies any ETOH cravings.   Past Psychiatric Medication Trials: Ambien- Took x 1 Gabapentin- Started a few weeks ago. Has been helpful for sleep. Sertraline Xanax Lorazepam Hydroxyzine Trazodone  GAD-7     Office Visit from 01/20/2020 in [facility] Office Visit from 10/30/2019 in [facility]  Total GAD-7 Score 0 3    PHQ2-9     Office Visit from 01/20/2020 in [facility] Office Visit from 10/30/2019 in [facility] Office Visit from 10/10/2019 in [facility]  PHQ-2 Total Score 1 1 4   PHQ-9 Total Score -- 4 16       Review of Systems:  Review of Systems  Gastrointestinal: Negative.   Musculoskeletal: Negative for gait problem.  Neurological: Negative for tremors and headaches.  Psychiatric/Behavioral:       Please refer to HPI    Medications: I have reviewed the patient's current medications.  Current Outpatient Medications  Medication Sig Dispense Refill  . ARGININE PO Take by  mouth.    . B Complex Vitamins (VITAMIN-B COMPLEX PO) Take by mouth.    . Cholecalciferol (VITAMIN D3) 250 MCG (10000 UT) capsule     . finasteride (PROPECIA) 1 MG tablet Take 1 tablet (1 mg total) by mouth daily. 30 tablet 4  . gabapentin (NEURONTIN) 600 MG tablet Take 1 tablet (600 mg total) by mouth at bedtime. 90 tablet 0  . Magnesium 500 MG CAPS Take by mouth.    MAGNESIUM CHLORIDE PO Take 500 mg by mouth in the morning and at bedtime.    . medium chain triglycerides (MCT OIL) oil Take by mouth daily.    . Methylsulfonylmethane (MSM) 1000 MG CAPS Take 3,000 mg by mouth daily.    . Multiple Vitamin (MULTIVITAMIN WITH MINERALS) TABS Take 1 tablet by mouth daily.    . Omega-3 Fatty Acids (FISH OIL) 1000 MG CAPS Take by mouth.    . ondansetron (ZOFRAN-ODT) 4 MG disintegrating tablet Take 1 tablet (4 mg total) by mouth every 8 (eight) hours as needed for nausea or vomiting. 20 tablet 1  . sertraline (ZOLOFT) 100 MG tablet Take 1 tablet (100 mg total) by mouth daily. 90 tablet 0  . SYNTHROID 100 MCG tablet TAKE 1 TABLET(100 MCG) BY MOUTH DAILY 90 tablet 1  . traZODone (DESYREL) 50 MG tablet Take 1 tablet (50 mg total) by mouth at bedtime as needed for sleep. 90 tablet 0  . UNABLE TO FIND Wheatgrass Juice    . UNABLE TO FIND Vitamin K2 with  MK-& and MK-4    . doxycycline (VIBRA-TABS) 100 MG tablet Take 1 tablet (100 mg total) by mouth 2 (two) times daily. (Patient not taking: Reported on 04/19/2020) 14 tablet 0   No current facility-administered medications for this visit.    Medication Side Effects: None  Allergies: No Known Allergies  Past Medical History:  Diagnosis Date  . Acne   . Allergic rhinitis   . Compartment syndrome (HCC) 2010   right leg  . Depression   . Hypothyroidism   . Sever's disease     Family History  Problem Relation Age of Onset  . Prostate cancer Father 558  . Autism Brother   . Diabetes Other   . Allergies Other   . Alcohol abuse Other        mom's  side of the family  . Alcohol abuse Maternal Grandfather     Social History   Socioeconomic History  . Marital status: Single    Spouse name: Not on file  . Number of children: Not on file  . Years of education: Not on file  . Highest education level: Not on file  Occupational History  . Not on file  Tobacco Use  . Smoking status: Former Smoker    Types: Cigarettes    Quit date: 09/26/2019    Years since quitting: 0.5  . Smokeless tobacco: Former NeurosurgeonUser    Types: Snuff  . Tobacco comment: 04/30/2012 "smoked & used snuff maybe for 1 wk total"  Vaping Use  . Vaping Use: Never used  Substance and Sexual Activity  . Alcohol use: Yes    Alcohol/week: 10.0 standard drinks    Types: 10 Shots of liquor per week    Comment: Drinks a 5th daily  . Drug use: No  . Sexual activity: Yes    Birth control/protection: None  Other Topics Concern  . 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 Difficulty of Paying Living Expenses: Not on file  Food Insecurity:   . Worried About Programme researcher, broadcasting/film/videounning Out of Food in the Last Year: Not on file  . Ran Out of Food in the Last Year: Not on file  Transportation Needs:   . Lack of Transportation (Medical): Not on file  . Lack of Transportation (Non-Medical): Not on file  Physical Activity:   . Days of Exercise per Week: Not on file  . Minutes of Exercise per Session: Not on file  Stress:   . Feeling of Stress : Not on file  Social Connections:   . Frequency of Communication with Friends and Family: Not on file  . Frequency of Social Gatherings with Friends and Family: Not on file  . Attends Religious Services: Not on file  . Active Member of Clubs or Organizations: Not on file  . Attends BankerClub or Organization Meetings: Not on file  .  Marital Status: Not on file  Intimate Partner Violence:   . Fear of Current or Ex-Partner: Not on file  . Emotionally Abused: Not on file  . Physically Abused: Not on file  . Sexually Abused: Not on file    Past Medical History, Surgical history, Social history, and Family history were reviewed and updated  as appropriate.   Please see review of systems for further details on the patient's review from today.   Objective:   Physical Exam:  There were no vitals taken for this visit.  Physical Exam Constitutional:      General: He is not in acute distress. Musculoskeletal:        General: No deformity.  Neurological:     Mental Status: He is alert and oriented to person, place, and time.     Coordination: Coordination normal.  Psychiatric:        Attention and Perception: Attention and perception normal. He does not perceive auditory or visual hallucinations.        Mood and Affect: Mood normal. Mood is not anxious or depressed. Affect is not labile, blunt, angry or inappropriate.        Speech: Speech normal.        Behavior: Behavior normal.        Thought Content: Thought content normal. Thought content is not paranoid or delusional. Thought content does not include homicidal or suicidal ideation. Thought content does not include homicidal or suicidal plan.        Cognition and Memory: Cognition and memory normal.        Judgment: Judgment normal.     Comments: Insight intact     Lab Review:     Component Value Date/Time   NA 140 11/09/2019 1842   K 4.1 11/09/2019 1842   CL 103 11/09/2019 1842   CO2 25 11/09/2019 1842   GLUCOSE 86 11/09/2019 1842   BUN 14 11/09/2019 1842   CREATININE 0.99 11/09/2019 1842   CREATININE 1.17 10/10/2019 1526   CALCIUM 8.9 11/09/2019 1842   PROT 6.2 11/25/2019 0724   ALBUMIN 4.3 11/25/2019 0724   AST 29 11/25/2019 0724   ALT 17 11/25/2019 0724   ALKPHOS 49 11/25/2019 0724   BILITOT 0.4 11/25/2019 0724   GFRNONAA >60 11/09/2019 1842    GFRAA >60 11/09/2019 1842       Component Value Date/Time   WBC 6.8 11/09/2019 1842   RBC 4.51 11/09/2019 1842   HGB 14.5 11/09/2019 1842   HCT 44.7 11/09/2019 1842   PLT 203 11/09/2019 1842   MCV 99.1 11/09/2019 1842   MCV 95.4 08/12/2011 1021   MCH 32.2 11/09/2019 1842   MCHC 32.4 11/09/2019 1842   RDW 12.4 11/09/2019 1842   LYMPHSABS 1,279 10/10/2019 1526   MONOABS 0.7 04/30/2012 1332   EOSABS 17 10/10/2019 1526   BASOSABS 26 10/10/2019 1526    No results found for: POCLITH, LITHIUM   No results found for: PHENYTOIN, PHENOBARB, VALPROATE, CBMZ   .res Assessment: Plan:   Will continue current plan of care since target signs and symptoms are well controlled without any tolerability issues.  Also discussed avoiding making any significant changes in medication since patient is preparing to relocate to start a new job and will be transferring care to a new provider.  Discussed that records can be sent to new provider with signed information release.  Encouraged patient to continue trying to establish mental health treatment and reports as soon as possible in his new geographical area. Continue sertraline 100 mg daily for anxiety and depression. Continue trazodone 50 mg at bedtime as needed for insomnia. Continue gabapentin 600 mg at bedtime for off label indications of anxiety and insomnia. Patient to follow-up on an as-needed basis.  Discussed that patient could be seen again for follow-up if he is having difficulties finding a  new provider.  Avedis was seen today for follow-up.  Diagnoses and all orders for this visit:  Social anxiety disorder -     gabapentin (NEURONTIN) 600 MG tablet; Take 1 tablet (600 mg total) by mouth at bedtime. -     sertraline (ZOLOFT) 100 MG tablet; Take 1 tablet (100 mg total) by mouth daily.  Insomnia, unspecified type -     gabapentin (NEURONTIN) 600 MG tablet; Take 1 tablet (600 mg total) by mouth at bedtime. -     traZODone (DESYREL) 50 MG  tablet; Take 1 tablet (50 mg total) by mouth at bedtime as needed for sleep.  Recurrent major depressive disorder, in full remission (HCC) -     sertraline (ZOLOFT) 100 MG tablet; Take 1 tablet (100 mg total) by mouth daily.     Please see After Visit Summary for patient specific instructions.  Future Appointments  Date Time Provider Department Center  06/28/2020  9:00 AM Panosh, Neta Mends, MD [facility]  06/28/2020  1:00 PM Corie Chiquito, PMHNP [facility] None    No orders of the defined types were placed in this encounter.   -------------------------------

## 2020-05-12 MED ORDER — FINASTERIDE 1 MG PO TABS
1.0000 mg | ORAL_TABLET | Freq: Every day | ORAL | 4 refills | Status: DC
Start: 2020-05-12 — End: 2020-10-18

## 2020-06-26 LAB — COVID-19 & INFLUENZA COMBO (COBAS)
INFLUENZA A: NEGATIVE
INFLUENZA B: NEGATIVE
SARS-CoV-2: POSITIVE — AB

## 2020-06-27 NOTE — Progress Notes (Deleted)
No chief complaint on file.   HPI: Patrick Hamilton 35 y.o. come in for Chronic disease management   Thyroid Under acre for depression  SAD  And etoh sud  Finasteride : medicaion ROS: See pertinent positives and negatives per HPI.  Past Medical History:  Diagnosis Date  . Acne   . Allergic rhinitis   . Compartment syndrome (HCC) 2010   right leg  . Depression   . Hypothyroidism   . Sever's disease     Family History  Problem Relation Age of Onset  . Prostate cancer Father 23  . Autism Brother   . Diabetes Other   . Allergies Other   . Alcohol abuse Other        mom's side of the family  . Alcohol abuse Maternal Grandfather     Social History   Socioeconomic History  . Marital status: Single    Spouse name: Not on file  . Number of children: Not on file  . Years of education: Not on file  . Highest education level: Not on file  Occupational History  . Not on file  Tobacco Use  . Smoking status: Former Smoker    Types: Cigarettes    Quit date: 09/26/2019    Years since quitting: 0.7  . Smokeless tobacco: Former Neurosurgeon    Types: Snuff  . Tobacco comment: 04/30/2012 "smoked & used snuff maybe for 1 wk total"  Vaping Use  . Vaping Use: Never used  Substance and Sexual Activity  . Alcohol use: Yes    Alcohol/week: 10.0 standard drinks    Types: 10 Shots of liquor per week    Comment: Drinks a 5th daily  . Drug use: No  . Sexual activity: Yes    Birth control/protection: None  Other Topics Concern  . 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 ARGININE PO Take by mouth.    . B Complex Vitamins (VITAMIN-B COMPLEX PO) Take by mouth.    . Cholecalciferol (VITAMIN D3) 250 MCG (10000 UT) capsule     . doxycycline (VIBRA-TABS) 100 MG tablet Take 1 tablet (100 mg total) by mouth 2 (two) times daily. (Patient not taking: Reported on 04/19/2020) 14 tablet 0  . finasteride (PROPECIA) 1 MG tablet Take 1 tablet (1 mg total) by mouth daily. 30 tablet 4  . gabapentin (NEURONTIN) 600 MG tablet Take 1 tablet (600 mg total) by mouth at bedtime. 90 tablet 0  . Magnesium 500 MG CAPS Take by mouth.    Marland Kitchen MAGNESIUM CHLORIDE PO Take 500 mg by mouth in the morning and at bedtime.    . medium chain triglycerides (MCT OIL) oil Take by mouth daily.    . Methylsulfonylmethane (MSM) 1000 MG CAPS Take 3,000 mg by mouth daily.    . Multiple Vitamin (MULTIVITAMIN WITH MINERALS) TABS Take 1 tablet by mouth daily.    . Omega-3 Fatty Acids (FISH OIL) 1000  MG CAPS Take by mouth.    . ondansetron (ZOFRAN-ODT) 4 MG disintegrating tablet Take 1 tablet (4 mg total) by mouth every 8 (eight) hours as needed for nausea or vomiting. 20 tablet 1  . sertraline (ZOLOFT) 100 MG tablet Take 1 tablet (100 mg total) by mouth daily. 90 tablet 0  . SYNTHROID 100 MCG tablet TAKE 1 TABLET(100 MCG) BY MOUTH DAILY 90 tablet 1  . traZODone (DESYREL) 50 MG tablet Take 1 tablet (50 mg total) by mouth at bedtime as needed for sleep. 90 tablet 0  . UNABLE TO FIND Wheatgrass Juice    . UNABLE TO FIND Vitamin K2 with MK-& and MK-4     No facility-administered medications prior to visit.     EXAM:  There were no vitals taken for this visit.  There is no height or weight on file to calculate BMI. Wt Readings from Last 3 Encounters:  03/05/20 205 lb (93 kg)  03/02/20 195 lb (88.5 kg)  12/26/19 205 lb 3.2 oz (93.1 kg)    GENERAL: vitals reviewed and listed above, alert, oriented, appears well hydrated and in no acute distress HEENT:  atraumatic, conjunctiva  clear, no obvious abnormalities on inspection of external nose and ears OP : masked te  NECK: no obvious masses on inspection palpation  LUNGS: clear to auscultation bilaterally, no wheezes, rales or rhonchi, good air movement CV: HRRR, no clubbing cyanosis or  peripheral edema nl cap refill  MS: moves all extremities without noticeable focal  abnormality PSYCH: pleasant and cooperative, no obvious depression or anxiety Lab Results  Component Value Date   WBC 6.8 11/09/2019   HGB 14.5 11/09/2019   HCT 44.7 11/09/2019   PLT 203 11/09/2019   GLUCOSE 86 11/09/2019   CHOL 147 11/10/2019   TRIG 99 11/10/2019   HDL 32 (L) 11/10/2019   LDLCALC 95 11/10/2019   ALT 17 11/25/2019   AST 29 11/25/2019   NA 140 11/09/2019   K 4.1 11/09/2019   CL 103 11/09/2019   CREATININE 0.99 11/09/2019   BUN 14 11/09/2019   CO2 25 11/09/2019   TSH 2.05 11/25/2019   PSA 0.97 08/12/2011   INR 1.18 04/30/2012   HGBA1C 5.0 11/10/2019   BP Readings from Last 3 Encounters:  03/02/20 132/72  12/26/19 120/72  11/09/19 (!) 147/67    ASSESSMENT AND PLAN:  Discussed the following assessment and plan:  No diagnosis found.  -Patient advised to return or notify health care team  if  new concerns arise.  There are no Patient Instructions on file for this visit.   Neta Mends. Taitum Menton M.D.

## 2020-06-28 ENCOUNTER — Ambulatory Visit: Payer: Self-pay | Admitting: Internal Medicine

## 2020-06-28 ENCOUNTER — Ambulatory Visit: Payer: BC Managed Care – PPO | Admitting: Psychiatry

## 2020-10-02 LAB — ETHANOL: Ethanol Lvl: 312 mg/dL (ref 0.0–10.0)

## 2020-10-02 NOTE — ED Notes (Signed)
ED Patient Education Note     Patient Education Materials Follows:  Mental and Behavioral Health     Alcohol Intoxication    Alcohol intoxication occurs when a person no longer thinks clearly or functions well (becomes impaired) after drinking alcohol. Intoxication can occur with just one drink. The legal definition of alcohol intoxication depends on the amount of alcohol in the blood (blood alcohol concentration, BAC). BAC of 80?100 mg/dL or higher is commonly considered legally intoxicated. The level of impairment depends on:   The amount of alcohol the person had.     The person's age, gender, and weight.     How often the person drinks.     Whether the person has other medical conditions, such as diabetes, seizures, or a heart condition.      Alcohol intoxication can range from mild to severe. The condition can be dangerous, especially if the person:   Also took certain drugs or prescription medicines.     Drinks a large amount of alcohol in a short period of time (binge drinks).  ? For women, binge drinking is having four or more drinks at one time.    ? For men, binge drinking is having five or more drinks at one time.        If you or anyone around you appears intoxicated, speak up and act.      What are the causes?    This condition is caused by drinking alcohol.      What increases the risk?    The following factors may make you more likely to develop this condition:   Peer pressure in young adults.     Difficulty managing stress.     History of drug or alcohol abuse.     Combining alcohol with drugs.     Family history of drug or alcohol abuse.     Low body weight.     Binge drinking.        What are the signs or symptoms?    Symptoms of alcohol intoxication can vary from person to person. Symptoms can be mild, moderate, or severe.    Symptoms of mild alcohol intoxication may include:   Feeling relaxed or sleepy.     Having mild difficulty with coordination, speech, memory, or attention.      Symptoms of  moderate alcohol intoxication may include:   Extreme emotions, like anger or sadness.     Moderate difficulty with coordination, speech, memory, or attention.      Symptoms of severe alcohol intoxication may include:   Severe difficulty with coordination, speech, memory, or attention.     Passing out.     Vomiting.     Confusion.     Slow breathing.     Coma.      Intoxication can change quickly from mild to severe. It can cause coma or death, especially in people who are not exposed to alcohol often.      How is this diagnosed?    Your health care provider will ask you how much alcohol you drank and what kind you had. Intoxication may also be diagnosed based on:   Your symptoms and medical history.     A physical exam.     A blood test that measures BAC.     A smell of alcohol on your breath.        How is this treated?    Treatment for alcohol intoxication may include:   Being   monitored in an emergency department, hospital, or treatment center until your BAC comes down and it is safe for you to go home.     IV fluids to prevent or treat loss of fluid in the body (dehydration).     Medicine to treat nausea or vomiting or to get rid of alcohol in the body.     Counseling (brief intervention) about the dangers of using alcohol.     Treatment for substance use disorder.     Oxygen therapy or a breathing machine (ventilator).      Long-term (chronic) exposure to alcohol can have long-term effects on your brain, heart, and gastrointestinal system. These effects can be serious and may also require treatment.      Follow these instructions at home:      Eating and drinking       Do not drink alcohol if:  ? Your health care provider tells you not to drink.    ? You are pregnant, may be pregnant, or are planning to become pregnant.    ? You are under the legal drinking age (34 years old in the [place]    ? You are taking medicines that should not be taken with alcohol.    ? You have a medical condition, and alcohol makes it  worse.    ? You need to drive or perform activities that require you to be alert.    ? You have substance use disorder.       Ask your health care provider if alcohol is safe for you. If your health care provider allows you to drink alcohol, limit how much you have. You may drink:  ? 0?1 drink a day for women.     ?  0?2 drinks a day for men.   ? Be aware of how much alcohol is in your drink. In the [place] one drink equals one 12 oz bottle of beer (355 mL), one 5 oz glass of wine (148 mL), or one 1? oz shot of hard liquor (44 mL).         Avoid drinking alcohol on an empty stomach.     Stay hydrated. Drink enough fluid to keep your urine pale yellow. Avoid caffeine because it can dehydrate you.     Avoid drinking more than one drink per hour.     When having multiple drinks, drink water or a non-alcoholic beverage between alcoholic drinks.      General instructions     Take over-the-counter and prescription medicines only as told by your health care provider.     Do not drive after drinking any amount of alcohol. Plan for a designated driver or another way to go home.     Have someone responsible stay with you while you are intoxicated. You should not be left alone.     Keep all follow-up visits as told by your health care provider. This is important.        Contact a health care provider if:     You do not feel better after a few days.     You have problems at work, at school, or at home due to drinking.      Get help right away if:     You have any of the following:  ? Moderate to severe trouble with coordination, speech, memory, or attention.    ? Trouble staying awake.    ? Severe confusion.    ? A seizure.    ?   Light-headedness.    ? Fainting.    ? Vomiting bright red blood or material that looks like coffee grounds.    ? Bloody stool (feces). The blood may make your stool bright red, black, or tarry. It may also smell bad.    ? Shakiness when trying to stop drinking.    ? Thoughts about hurting yourself or  others.      If you ever feel like you may hurt yourself or others, or have thoughts about taking your own life, get help right away. You can go to your nearest emergency department or call:   Your local emergency services (911 in the [place]     A suicide crisis helpline, such as the National Suicide Prevention Lifeline at 1-800-273-8255. This is open 24 hours a day.        Summary     Alcohol intoxication occurs when a person no longer thinks clearly or functions well after drinking alcohol.     If your health care provider says that alcohol is safe for you, limit alcohol intake to no more than 1 drink a day for women (no drinks if you are pregnant) and 2 drinks a day for men. One drink equals 12 oz of beer, 5 oz of wine, or 1? oz of hard liquor.     Contact your health care provider if drinking has caused you problems at work, school, or home.     Get help right away if you have thoughts about hurting yourself or others.      This information is not intended to replace advice given to you by your health care provider. Make sure you discuss any questions you have with your health care provider.      Document Revised: 10/02/2017 Document Reviewed: 10/02/2017  Elsevier Patient Education ? 2021 [employer]

## 2020-10-02 NOTE — ED Notes (Signed)
ED Triage Note       ED Secondary Triage Entered On:  10/02/2020 1:53 EDT    Performed On:  10/02/2020 1:52 EDT by Senior,  Denny Peon N-RN               General Information   Barriers to Learning :   Other: ETOH   ED Home Meds Section :   Document assessment   [facility] ED Fall Risk Section :   Document assessment   ED Advance Directives Section :   Document assessment   ED Palliative Screen :   N/A (prefilled for <34yo)   Senior,  Erin N-RN - 10/02/2020 1:52 EDT   (As Of: 10/02/2020 01:53:24 EDT)   Diagnoses(Active)    Alcohol intoxication  Date:   10/02/2020 ; Diagnosis Type:   Reason For Visit ; Confirmation:   Complaint of ; Clinical Dx:   Alcohol intoxication ; Classification:   Medical ; Clinical Service:   Emergency medicine ; Code:   PNED ; Probability:   0 ; Diagnosis Code:   [id number]      Alcohol intoxication  Date:   10/02/2020 ; Diagnosis Type:   Reason For Visit ; Confirmation:   Confirmed ; Clinical Dx:   Alcohol intoxication ; Classification:   Medical ; Clinical Service:   Emergency medicine ; Code:   PNED ; Probability:   0 ; Diagnosis Code:   [id number]             -    Procedure History   (As Of: 10/02/2020 01:53:24 EDT)     Phoebe Perch Fall Risk Assessment Tool   Hx of falling last 3 months ED Fall :   No   Patient confused or disoriented ED Fall :   Unable to assess   Patient intoxicated or sedated ED Fall :   Yes   Patient impaired gait ED Fall :   Unable to assess   Use a mobility assistance device ED Fall :   Unable to assess   Patient altered elimination ED Fall :   No   [facility] ED Fall Score :   [address]    Senior,  Erin N-RN - 10/02/2020 1:52 EDT   ED Advance Directive   Advance Directive :   No   Senior,  Erin N-RN - 10/02/2020 1:52 EDT   Med Hx   Medication List   (As Of: 10/02/2020 01:53:24 EDT)   Ardelia Mems To Obtain     Senior,  Erin N-RN - 10/02/2020 01:53:05      Normal Order    Sodium Chloride 0.9% intravenous solution Bolus  :   Sodium Chloride 0.9% intravenous  solution Bolus ; Status:   Ordered ; Ordered As Mnemonic:   Sodium Chloride 0.9% bolus ; Simple Display Line:   1,000 mL, 2000 mL/hr, IV Piggyback, Once ; Ordering Provider:   BURNS,  SCOTT BARRON-MD; Catalog Code:   Sodium Chloride 0.9% ; Order Dt/Tm:   10/02/2020 01:47:16 EDT

## 2020-10-02 NOTE — Discharge Summary (Signed)
ED Clinical Summary                        [facility]  [address]  [place], [place], [address]  [phone]           PERSON INFORMATION  Name: Patrick Kaufman, Patrick Kaufman Age:  35 Years DOB: 1985/08/26   Sex: Male Language: English PCP: PCP,  NONE   Marital Status: Unknown Phone: [phone] Med Service: MED-Medicine   MRN: [id number] Acct# [id number] Arrival: 10/02/2020 01:45:00   Visit Reason: Alcohol intoxication; Alcohol intoxication; ETOH/EMS Acuity: 2 LOS: 000 06:44   Address:    [address] [place] [address]   Diagnosis:    Alcohol intoxication  Medications:    Medications Administered During Visit:                Medication Dose Route   Sodium Chloride 0.9% 1000 mL IV Piggyback               Allergies      No Known Medication Allergies      Major Tests and Procedures:  The following procedures and tests were performed during your ED visit.  COMMON PROCEDURES%>  COMMON PROCEDURES COMMENTS%>                PROVIDER INFORMATION               Provider Role Assigned Sabino Niemann ED Provider 10/02/2020 01:45:17    Anastasia Fiedler RN, [facility] ED Nurse 10/02/2020 02:06:51 10/02/2020 07:34:55   Linton Flemings, Rebecca-RN ED Nurse 10/02/2020 07:07:24        Attending Physician:  BURNS,  SCOTT BARRON-MD      Admit Doc  BURNS,  SCOTT BARRON-MD     Consulting Doc       VITALS INFORMATION  Vital Sign Triage Latest   Temp Oral ORAL_1%> ORAL%>   Temp Temporal TEMPORAL_1%> TEMPORAL%>   Temp Intravascular INTRAVASCULAR_1%> INTRAVASCULAR%>   Temp Axillary AXILLARY_1%> AXILLARY%>   Temp Rectal RECTAL_1%> RECTAL%>   02 Sat 98 % 98 %   Respiratory Rate RATE_1%> RATE%>   Peripheral Pulse Rate PULSE RATE_1%>67 bpm PULSE RATE%>   Apical Heart Rate HEART RATE_1%> HEART RATE%>   Blood Pressure BLOOD PRESSURE_1%>/ BLOOD PRESSURE_1%>71 mmHg BLOOD PRESSURE%> / BLOOD PRESSURE%>75 mmHg                 Immunizations      No Immunizations Documented This Visit          DISCHARGE INFORMATION   Discharge Disposition: H  Outpt-Sent Home   Discharge Location:  Home   Discharge Date and Time:  10/02/2020 08:29:07   ED Checkout Date and Time:  10/02/2020 08:29:07     DEPART REASON INCOMPLETE INFORMATION               Depart Action Incomplete Reason   Interactive View/I&O Recently assessed               Problems      No Problems Documented              Smoking Status      No Smoking Status Documented         PATIENT EDUCATION INFORMATION  Instructions:     Alcohol Intoxication     Follow up:                   With: Address: When:  Follow up with primary care provider  Within 1 week   Comments:   Stop drinking alcohol to excess. Return immediately if worse.              ED PROVIDER DOCUMENTATION     Patient:   Patrick Kaufman, Patrick Kaufman            MRN: [id number]            FIN: [id number]               Age:   35 years     Sex:  Male     DOB:  10/24/1985   Associated Diagnoses:   Alcohol intoxication   Author:   Blossom Hoops L-MD      Basic Information   Addendum: Time of addendum:: 10/02/2020 08:07:00 , Assumed care from: BURNS,  Timoteo Gaul.      Medical Decision Making   Documents reviewed:  Emergency department nurses' notes, emergency department records, prior records.    Results review:  Lab results : Lab View   10/02/2020 2:04 EDT        Ethanol Lvl               312.0 mg/dL  CRIT  .      Reexamination/ Reevaluation   Time: 10/02/2020 08:08:00 .   Vital signs   results included from flowsheet : Vital Signs   10/02/2020 7:16 EDT Systolic Blood Pressure 103 mmHg    Diastolic Blood Pressure 58 mmHg  LOW    Peripheral Pulse Rate 58 bpm  LOW    Respiratory Rate 15 br/min    Mean Arterial Pressure, Cuff 70 mmHg    SpO2 98 %      Course: improving.   Assessment: reexam performed, exam improved.   Notes: Patient was transitioned pending sobriety.  He is now much more sober after observing him for prolonged period in the emergency department.  He can ambulate on his own without any difficulty.  We will help him arrange for self transportation home.   The patient himself has no other questions or concerns..      Impression and Plan   Diagnosis   Alcohol intoxication (ICD10-CM F10.929, Discharge, Medical)   Plan   Condition: Improved, Stable.    Disposition: Medically cleared, Discharged: to home.    Patient was given the following educational materials: Alcohol Intoxication.    Follow up with: Follow up with primary care provider Within 1 week Stop drinking alcohol to excess.  Return immediately if worse..    Counseled: Patient, Regarding diagnosis, Regarding diagnostic results, Regarding treatment plan, Regarding prescription, Patient indicated understanding of instructions.       Patient:   Patrick Kaufman, Patrick Kaufman            MRN: [id number]            FIN: [id number]               Age:   35 years     Sex:  Male     DOB:  07/31/1985   Associated Diagnoses:   Alcohol intoxication   Author:   BURNS,  Timoteo Gaul      Basic Information   Time seen: Provider Seen (ST)   ED Provider/Time:    BURNS,  SCOTT BARRON-MD / 10/02/2020 01:45  .   Additional information: Chief Complaint from Nursing Triage Note   Chief Complaint   No qualifying data available.Marland Kitchen  History of Present Illness   The patient presents with alcohol intoxication.  Patient is a 35 year old male who arrives via EMS.  Reportedly patient was picked up by the police for aggressive behavior and promptly vomited.  EMS was called and patient was brought to this facility.  On arrival patient is obviously intoxicated, has vomited again.  No reports of any trauma.  Patient is most obtunded presently.  Withdraws to pain.  No other complaints..        Review of Systems   Constitutional symptoms:  Negative except as documented in HPI.   Skin symptoms:  Negative except as documented in HPI.   Eye symptoms:  Negative except as documented in HPI.   ENMT symptoms:  Negative except as documented in HPI.   Respiratory symptoms:  Negative except as documented in HPI.   Cardiovascular symptoms:  Negative except as  documented in HPI.   Gastrointestinal symptoms:  Negative except as documented in HPI.   Genitourinary symptoms   Musculoskeletal symptoms:  Negative except as documented in HPI.   Neurologic symptoms:  Altered level of consciousness.   Psychiatric symptoms:  Negative except as documented in HPI.   Endocrine symptoms:  Negative except as documented in HPI.   Hematologic/Lymphatic symptoms:  Negative except as documented in HPI.   Allergy/immunologic symptoms:  Negative except as documented in HPI.      Health Status   Allergies:    Allergic Reactions (Selected)  No Known Medication Allergies.   Medications:  (Selected)   Inpatient Medications  Ordered  Sodium Chloride 0.9% bolus: 1,000 mL, 2000 mL/hr, IV Piggyback, Once.      Past Medical/ Family/ Social History   Surgical history:    No active procedure history items have been selected or recorded., Reviewed as documented in chart.   Family history: Reviewed as documented in chart.   Social history: Reviewed as documented in chart.   Problem list:    No qualifying data available  , per nurse's notes.      Physical Examination   General:  Intoxicated appearing, Not alert,    Skin:  Warm.   Head:  Normocephalic, atraumatic.    Eye:  Pupils are equal, round and reactive to light.   Ears, nose, mouth and throat:  Tympanic membranes clear.   Respiratory:  Lungs are clear to auscultation.   Chest wall:  No tenderness.   Musculoskeletal:  Normal ROM.   Gastrointestinal:  Soft, Nontender.    Neurological:  Patient moves all 4 extremities, does not follow commands..   Psychiatric:  Obtunded.      Medical Decision Making   Notes:  10/02/2020 05:46:23: Patient is presently awake.  Still appears mildly intoxicated however is ambulatory to the restroom.  Oriented.  We will plan for discharge..      Impression and Plan   Diagnosis   Alcohol intoxication (ICD10-CM F10.929, Discharge, Medical)   Plan   Condition: Improved.    Disposition: Discharged: to home.    Patient was given  the following educational materials: Alcohol Intoxication.    Follow up with: Follow up with primary care provider Within 1 week Stop drinking alcohol to excess.  Return immediately if worse..    Counseled: Patient, Regarding diagnosis, Regarding diagnostic results.    Notes: This note was created using voice recognition software and may contain typographic errors missed during final review.  The intent is to have a complete and accurate medical record, though some errors will occur due to  the nature of the software and program.    As a valued partner in the safety effort, if you have noticed factual errors, please complete the health information amendment/correct form or call the [facility] Health Information Managment Office at [phone].  Marland Kitchen

## 2020-10-02 NOTE — ED Notes (Signed)
ED Triage Note       ED Triage Adult Entered On:  10/02/2020 1:52 EDT    Performed On:  10/02/2020 1:49 EDT by Senior,  Denny Peon N-RN               Triage   Numeric Rating Pain Scale :   0 = No pain   Chief Complaint :   Pt presents with EMS intoxicated and responsive to painful stimuli. Pt had an episode of emesis for ems.    [place] Mode of Arrival :   Ambulance   Infectious Disease Documentation :   Document assessment   Temperature Oral :   36.5 degC(Converted to: 97.7 degF)    Heart Rate Monitored :   78 bpm   Respiratory Rate :   18 br/min   Systolic Blood Pressure :   121 mmHg   Diastolic Blood Pressure :   71 mmHg   SpO2 :   98 %   Oxygen Therapy :   Room air   Patient presentation :   Altered mental status, new onset   Chief Complaint or Presentation suggest infection :   No   Weight Dosing :   93.1 kg(Converted to: 205 lb 4 oz)    Height :   185 cm(Converted to: 6 ft 1 in)    Body Mass Index Dosing :   27 kg/m2   Senior,  Denny Peon N-RN - 10/02/2020 1:49 EDT   DCP GENERIC CODE   Tracking Acuity :   2   Tracking Group :   ED [employer]   Senior,  Erin N-RN - 10/02/2020 1:49 EDT   ED General Section :   Document assessment   Pregnancy Status :   N/A   ED Allergies Section :   Document assessment   ED Reason for Visit Section :   Document assessment   Senior,  Denny Peon N-RN - 10/02/2020 1:49 EDT   PTA/Triage Treatments   ED PTA Pre-Arrival Service :   [facility]   Senior,  Roberts N-RN - 10/02/2020 1:49 EDT   ID Risk Screen Symptoms   Recent Travel History :   Unable to obtain   Close Contact with COVID-19 ID :   No   Last 14 days COVID-19 ID :   No   Senior,  Erin N-RN - 10/02/2020 1:49 EDT   Allergies   (As Of: 10/02/2020 01:52:14 EDT)   Allergies (Active)   No Known Medication Allergies  Estimated Onset Date:   Unspecified ; Created By:   BURNS,  Timoteo Gaul; Reaction Status:   Active ; Category:   Drug ; Substance:   No Known Medication Allergies ; Type:   Allergy ; Updated By:   Debera Lat;  Reviewed Date:   10/02/2020 1:50 EDT        Psycho-Social   Last 3 mo, thoughts killing self/others :   Patient denies   Right click within box for Suspected Abuse policy link. :   None   Feels Safe Where Live :   Yes   Senior,  Denny Peon N-RN - 10/02/2020 1:49 EDT   ED Reason for Visit   (As Of: 10/02/2020 01:52:14 EDT)   Diagnoses(Active)    Alcohol intoxication  Date:   10/02/2020 ; Diagnosis Type:   Reason For Visit ; Confirmation:   Confirmed ; Clinical Dx:   Alcohol intoxication ; Classification:   Medical ; Clinical Service:   Emergency medicine ;  Code:   PNED ; Probability:   0 ; Diagnosis Code:   [id number]      Alcohol intoxication  Date:   10/02/2020 ; Diagnosis Type:   Reason For Visit ; Confirmation:   Complaint of ; Clinical Dx:   Alcohol intoxication ; Classification:   Medical ; Clinical Service:   Emergency medicine ; Code:   PNED ; Probability:   0 ; Diagnosis Code:   [id number]

## 2020-10-02 NOTE — ED Provider Notes (Signed)
Addendum *ED        Patient:   Patrick Kaufman, Patrick Kaufman            MRN: [id number]            FIN: [id number]               Age:   35 years     Sex:  Male     DOB:  [birth year]   Associated Diagnoses:   Alcohol intoxication   Author:   Blossom Hoops L-MD      Basic Information   Addendum: Time of addendum:: 10/02/2020 08:07:00 , Assumed care from: BURNS,  Timoteo Gaul.      Medical Decision Making   Documents reviewed:  Emergency department nurses' notes, emergency department records, prior records.    Results review:  Lab results : Lab View   10/02/2020 2:04 EDT        Ethanol Lvl               312.0 mg/dL  CRIT  .      Reexamination/ Reevaluation   Time: 10/02/2020 08:08:00 .   Vital signs   results included from flowsheet : Vital Signs   10/02/2020 7:16 EDT Systolic Blood Pressure 103 mmHg    Diastolic Blood Pressure 58 mmHg  LOW    Peripheral Pulse Rate 58 bpm  LOW    Respiratory Rate 15 br/min    Mean Arterial Pressure, Cuff 70 mmHg    SpO2 98 %      Course: improving.   Assessment: reexam performed, exam improved.   Notes: Patient was transitioned pending sobriety.  He is now much more sober after observing him for prolonged period in the emergency department.  He can ambulate on his own without any difficulty.  We will help him arrange for self transportation home.  The patient himself has no other questions or concerns..      Impression and Plan   Diagnosis   Alcohol intoxication (ICD10-CM F10.929, Discharge, Medical)   Plan   Condition: Improved, Stable.    Disposition: Medically cleared, Discharged: to home.    Patient was given the following educational materials: Alcohol Intoxication.    Follow up with: Follow up with primary care provider Within 1 week Stop drinking alcohol to excess.  Return immediately if worse..    Counseled: Patient, Regarding diagnosis, Regarding diagnostic results, Regarding treatment plan, Regarding prescription, Patient indicated understanding of instructions.    Signature Line      Electronically Signed on 10/02/2020 08:13 AM EDT   ________________________________________________   Blossom Hoops L-MD

## 2020-10-02 NOTE — ED Notes (Signed)
 ED Patient Summary       ;       [facility] Emergency Department  [address], [place] [address]  [phone]  Discharge Instructions (Patient)  Name: Hulett, Thaxton MORGAN  DOB:  1986/02/22                   MRN: [id number]                   FIN: [phone]  Reason For Visit: Alcohol intoxication; Alcohol intoxication; ETOH/EMS  Final Diagnosis: Alcohol intoxication     Visit Date: 10/02/2020 01:45:00  Address: [address] [place] [address]  Phone: [phone]     Emergency Department Providers:         Primary Physician:      BURNS, SCOTT [facility] would like to thank you for allowing us  to assist you with your healthcare needs. The following includes patient education materials and information regarding your injury/illness.     Follow-up Instructions:  You were seen today on an emergency basis. Please contact your primary care doctor for a follow up appointment. If you received a referral to a specialist doctor, it is important you follow-up as instructed.    It is important that you call your follow-up doctor to schedule and confirm the location of your next appointment. Your doctor may practice at multiple locations. The office location of your follow-up appointment may be different to the one written on your discharge instructions.    If you do not have a primary care doctor, please call [phone] for help in finding a [facility] Provider. For help in finding a specialist doctor, please call [phone].    If your condition gets worse before your follow-up with your primary care doctor or specialist, please return to the Emergency Department.      Coronavirus 2019 (COVID-19) Reminders:     Patients age 73 - 67, with parental consent, and patients over age 51 can make an appointment for a COVID-19 vaccine. Patients can contact their [facility] doctors' offices to schedule an appointment to receive the  COVID-19 vaccine. Patients who do not have a [facility] physician can call [phone] to schedule vaccination appointments.      Follow Up Appointments:  Primary Care Provider:     Name: PCP,  NONE     Phone:                  With: Address: When:   Follow up with primary care provider  Within 1 week   Comments:   Stop drinking alcohol to excess. Return immediately if worse.              [facility] SERVICES%>    Allergy Info: No Known Medication Allergies     Discharge Additional Information          Discharge Patient 10/02/20 8:07:00 EDT      Patient Education Materials:        Alcohol Intoxication    Alcohol intoxication occurs when a person no longer thinks clearly or functions well (becomes impaired) after drinking alcohol. Intoxication can occur with just one drink. The legal definition of alcohol intoxication depends on the amount of alcohol in the blood (blood alcohol concentration, BAC). BAC of 80?100 mg/dL or higher is commonly considered legally intoxicated. The level of  impairment depends on:   The amount of alcohol the person had.     The person's age, gender, and weight.     How often the person drinks.     Whether the person has other medical conditions, such as diabetes, seizures, or a heart condition.      Alcohol intoxication can range from mild to severe. The condition can be dangerous, especially if the person:   Also took certain drugs or prescription medicines.     Drinks a large amount of alcohol in a short period of time (binge drinks).  ? For women, binge drinking is having four or more drinks at one time.    ? For men, binge drinking is having five or more drinks at one time.        If you or anyone around you appears intoxicated, speak up and act.      What are the causes?    This condition is caused by drinking alcohol.      What increases the risk?    The following factors may make you more likely to develop this condition:   Peer pressure in young  adults.     Difficulty managing stress.     History of drug or alcohol abuse.     Combining alcohol with drugs.     Family history of drug or alcohol abuse.     Low body weight.     Binge drinking.        What are the signs or symptoms?    Symptoms of alcohol intoxication can vary from person to person. Symptoms can be mild, moderate, or severe.    Symptoms of mild alcohol intoxication may include:   Feeling relaxed or sleepy.     Having mild difficulty with coordination, speech, memory, or attention.      Symptoms of moderate alcohol intoxication may include:   Extreme emotions, like anger or sadness.     Moderate difficulty with coordination, speech, memory, or attention.      Symptoms of severe alcohol intoxication may include:   Severe difficulty with coordination, speech, memory, or attention.     Passing out.     Vomiting.     Confusion.     Slow breathing.     Coma.      Intoxication can change quickly from mild to severe. It can cause coma or death, especially in people who are not exposed to alcohol often.      How is this diagnosed?    Your health care provider will ask you how much alcohol you drank and what kind you had. Intoxication may also be diagnosed based on:   Your symptoms and medical history.     A physical exam.     A blood test that measures BAC.     A smell of alcohol on your breath.        How is this treated?    Treatment for alcohol intoxication may include:   Being monitored in an emergency department, hospital, or treatment center until your [facility] comes down and it is safe for you to go home.     IV fluids to prevent or treat loss of fluid in the body (dehydration).     Medicine to treat nausea or vomiting or to get rid of alcohol in the body.     Counseling (brief intervention) about the dangers of using alcohol.     Treatment for substance use disorder.  Oxygen therapy or a breathing machine (ventilator).      Long-term (chronic) exposure to alcohol can have long-term effects on your  brain, heart, and gastrointestinal system. These effects can be serious and may also require treatment.      Follow these instructions at home:      Eating and drinking       Do not drink alcohol if:  ? Your health care provider tells you not to drink.    ? You are pregnant, may be pregnant, or are planning to become pregnant.    ? You are under the legal drinking age (34 years old in the [place]    ? You are taking medicines that should not be taken with alcohol.    ? You have a medical condition, and alcohol makes it worse.    ? You need to drive or perform activities that require you to be alert.    ? You have substance use disorder.       Ask your health care provider if alcohol is safe for you. If your health care provider allows you to drink alcohol, limit how much you have. You may drink:  ? 0?1 drink a day for women.     ?  0?2 drinks a day for men.   ? Be aware of how much alcohol is in your drink. In the [place] one drink equals one 12 oz bottle of beer (355 mL), one 5 oz glass of wine (148 mL), or one 1? oz shot of hard liquor (44 mL).         Avoid drinking alcohol on an empty stomach.     Stay hydrated. Drink enough fluid to keep your urine pale yellow. Avoid caffeine because it can dehydrate you.     Avoid drinking more than one drink per hour.     When having multiple drinks, drink water or a non-alcoholic beverage between alcoholic drinks.      General instructions     Take over-the-counter and prescription medicines only as told by your health care provider.     Do not drive after drinking any amount of alcohol. Plan for a designated driver or another way to go home.     Have someone responsible stay with you while you are intoxicated. You should not be left alone.     Keep all follow-up visits as told by your health care provider. This is important.        Contact a health care provider if:     You do not feel better after a few days.     You have problems at work, at school, or at home due to  drinking.      Get help right away if:     You have any of the following:  ? Moderate to severe trouble with coordination, speech, memory, or attention.    ? Trouble staying awake.    ? Severe confusion.    ? A seizure.    ? Light-headedness.    ? Fainting.    ? Vomiting bright red blood or material that looks like coffee grounds.    ? Bloody stool (feces). The blood may make your stool bright red, black, or tarry. It may also smell bad.    ? Shakiness when trying to stop drinking.    ? Thoughts about hurting yourself or others.      If you ever feel like you may hurt yourself or others, or have  thoughts about taking your own life, get help right away. You can go to your nearest emergency department or call:   Your local emergency services (911 in the [place]     A suicide crisis helpline, such as the National Suicide Prevention Lifeline at 7097932362. This is open 24 hours a day.        Summary     Alcohol intoxication occurs when a person no longer thinks clearly or functions well after drinking alcohol.     If your health care provider says that alcohol is safe for you, limit alcohol intake to no more than 1 drink a day for women (no drinks if you are pregnant) and 2 drinks a day for men. One drink equals 12 oz of beer, 5 oz of wine, or 1? oz of hard liquor.     Contact your health care provider if drinking has caused you problems at work, school, or home.     Get help right away if you have thoughts about hurting yourself or others.      This information is not intended to replace advice given to you by your health care provider. Make sure you discuss any questions you have with your health care provider.      Document Revised: 10/02/2017 Document Reviewed: 10/02/2017  Elsevier Patient Education ? 2021 Elsevier Inc.      ---------------------------------------------------------------------------------------------------------------------  [facility] allows patients to review your COVID and other test  results as well as discharge documents from any [facility], Emergency Department, surgical center or outpatient lab. Test results are typically available 36 hours after the test is completed.     [facility] encourages you to self-enroll in the [facility] Patient Portal.     To begin your self-enrollment process, please visit [email or web address] Under [facility], click on "Sign up now".     NOTE: You must be 16 years and older to use [facility] Self-Enroll online. If you are a parent, caregiver, or guardian; you need an invite to access your child's or dependent's health records. To obtain an invite, contact the Medical Records department at [phone] Monday through Friday, 8-4:30, select option 3 . If we receive your call afterhours, we will return your call the next business day.     If you have issues trying to create or access your account, contact [employer] support at [phone] available 7 days a week 24 hours a day.     Comment:

## 2020-10-02 NOTE — ED Notes (Signed)
ED Pre-Arrival Note        Pre-Arrival Summary    Name:  [id number],    Current Date:  10/02/2020 01:49:29 EDT  Gender:  Male  Date of Birth:    Age:  35  Pre-Arrival Type:  EMS  ETA:  10/02/2020 02:07:00 EDT  Primary Care Physician:    Presenting Problem:  ETOH  Pre-Arrival User:  Senior,  Denny Peon N-RN  Referring Source:    Location:  [facility]  BP:  152/100  HR:  82  O2:  98            PreArrival Communication Form  Emergency Department        Additional Patient Information:        Orders:  [    ] CBC                                            [     ] CT Head no contrast  [    ] BMP                                           [     ] CT Abdomen/Pelvis no contrast  [    ] PT/INR                                       [     ] CT Abdomen/Pelvis IV contrast, w/ oral contrast  [    ] Troponin                                   [     ] CT Abdomen/Pelvis IV contrast, no oral contrast  [    ] BNP                                            [     ] See ordersheet  [    ] CXR                                             [     ] Other:__________________________  [    ] EKG

## 2020-10-02 NOTE — ED Provider Notes (Signed)
Alcohol intoxication        Patient:   Patrick Kaufman, Patrick Kaufman            MRN: [id number]            FIN: [id number]               Age:   35 years     Sex:  Male     DOB:  [birth year]   Associated Diagnoses:   Alcohol intoxication   Author:   Elvena Oyer,  Timoteo Gaul      Basic Information   Time seen: Provider Seen (ST)   ED Provider/Time:    Arlynn Stare,  Rylan Kaufmann BARRON-MD / 10/02/2020 01:45  .   Additional information: Chief Complaint from Nursing Triage Note   Chief Complaint   No qualifying data available.Marland Kitchen      History of Present Illness   The patient presents with alcohol intoxication.  Patient is a 35 year old male who arrives via EMS.  Reportedly patient was picked up by the police for aggressive behavior and promptly vomited.  EMS was called and patient was brought to this facility.  On arrival patient is obviously intoxicated, has vomited again.  No reports of any trauma.  Patient is most obtunded presently.  Withdraws to pain.  No other complaints..        Review of Systems   Constitutional symptoms:  Negative except as documented in HPI.   Skin symptoms:  Negative except as documented in HPI.   Eye symptoms:  Negative except as documented in HPI.   ENMT symptoms:  Negative except as documented in HPI.   Respiratory symptoms:  Negative except as documented in HPI.   Cardiovascular symptoms:  Negative except as documented in HPI.   Gastrointestinal symptoms:  Negative except as documented in HPI.   Genitourinary symptoms   Musculoskeletal symptoms:  Negative except as documented in HPI.   Neurologic symptoms:  Altered level of consciousness.   Psychiatric symptoms:  Negative except as documented in HPI.   Endocrine symptoms:  Negative except as documented in HPI.   Hematologic/Lymphatic symptoms:  Negative except as documented in HPI.   Allergy/immunologic symptoms:  Negative except as documented in HPI.      Health Status   Allergies:    Allergic Reactions (Selected)  No Known Medication Allergies.   Medications:   (Selected)   Inpatient Medications  Ordered  Sodium Chloride 0.9% bolus: 1,000 mL, 2000 mL/hr, IV Piggyback, Once.      Past Medical/ Family/ Social History   Surgical history:    No active procedure history items have been selected or recorded., Reviewed as documented in chart.   Family history: Reviewed as documented in chart.   Social history: Reviewed as documented in chart.   Problem list:    No qualifying data available  , per nurse's notes.      Physical Examination   General:  Intoxicated appearing, Not alert,    Skin:  Warm.   Head:  Normocephalic, atraumatic.    Eye:  Pupils are equal, round and reactive to light.   Ears, nose, mouth and throat:  Tympanic membranes clear.   Respiratory:  Lungs are clear to auscultation.   Chest wall:  No tenderness.   Musculoskeletal:  Normal ROM.   Gastrointestinal:  Soft, Nontender.    Neurological:  Patient moves all 4 extremities, does not follow commands..   Psychiatric:  Obtunded.      Medical Decision Making   Notes:  10/02/2020 05:46:23: Patient is presently awake.  Still appears mildly intoxicated however is ambulatory to the restroom.  Oriented.  We will plan for discharge..      Impression and Plan   Diagnosis   Alcohol intoxication (ICD10-CM F10.929, Discharge, Medical)   Plan   Condition: Improved.    Disposition: Discharged: to home.    Patient was given the following educational materials: Alcohol Intoxication.    Follow up with: Follow up with primary care provider Within 1 week Stop drinking alcohol to excess.  Return immediately if worse..    Counseled: Patient, Regarding diagnosis, Regarding diagnostic results.    Notes: This note was created using voice recognition software and may contain typographic errors missed during final review.  The intent is to have a complete and accurate medical record, though some errors will occur due to the nature of the software and program.    As a valued partner in the safety effort, if you have noticed factual errors,  please complete the health information amendment/correct form or call the [facility] Health Information Managment Office at [phone].  Armed forces training and education officer Signed on 10/02/2020 05:47 AM EDT   ________________________________________________   Nene Aranas,  Wanita Derenzo BARRON-MD               Modified by: Debera Lat on 10/02/2020 05:47 AM EDT

## 2020-10-16 ENCOUNTER — Other Ambulatory Visit: Payer: Self-pay | Admitting: Internal Medicine

## 2020-11-18 MED ORDER — SYNTHROID 100 MCG PO TABS: ORAL_TABLET | ORAL | 0 refills | Status: DC

## 2020-11-21 ENCOUNTER — Other Ambulatory Visit: Payer: Self-pay | Admitting: Internal Medicine

## 2020-12-31 MED ORDER — FINASTERIDE 1 MG PO TABS: ORAL_TABLET | ORAL | 2 refills | Status: DC

## 2021-01-17 ENCOUNTER — Other Ambulatory Visit: Payer: Self-pay

## 2021-01-18 MED ORDER — SYNTHROID 100 MCG PO TABS
ORAL_TABLET | ORAL | 0 refills | Status: DC
Start: 2021-01-18 — End: 2021-02-23

## 2021-02-14 MED ORDER — FINASTERIDE 1 MG PO TABS: ORAL_TABLET | ORAL | 1 refills | Status: DC

## 2021-02-23 ENCOUNTER — Other Ambulatory Visit: Payer: Self-pay | Admitting: Internal Medicine

## 2021-03-23 MED ORDER — SYNTHROID 100 MCG PO TABS: ORAL_TABLET | ORAL | 0 refills | Status: DC

## 2021-05-10 MED ORDER — FINASTERIDE 1 MG PO TABS: ORAL_TABLET | ORAL | 0 refills | Status: DC

## 2021-05-10 MED ORDER — SYNTHROID 100 MCG PO TABS: ORAL_TABLET | ORAL | 0 refills | Status: DC

## 2021-06-19 ENCOUNTER — Other Ambulatory Visit: Payer: Self-pay | Admitting: Internal Medicine

## 2021-07-01 ENCOUNTER — Encounter: Payer: Self-pay | Admitting: Internal Medicine

## 2021-07-05 MED ORDER — SYNTHROID 100 MCG PO TABS
ORAL_TABLET | ORAL | 0 refills | Status: AC
Start: 2021-07-05 — End: ?

## 2021-07-05 MED ORDER — FINASTERIDE 1 MG PO TABS: ORAL_TABLET | ORAL | 0 refills | Status: AC

## 2021-08-03 ENCOUNTER — Encounter: Payer: Self-pay | Admitting: Internal Medicine

## 2022-12-06 ENCOUNTER — Ambulatory Visit
Admit: 2022-12-06 | Discharge: 2022-12-06 | Payer: PRIVATE HEALTH INSURANCE | Attending: Family Medicine | Primary: Diagnostic Radiology

## 2022-12-06 ENCOUNTER — Encounter

## 2022-12-06 DIAGNOSIS — R300 Dysuria: Secondary | ICD-10-CM

## 2022-12-06 LAB — AMB POC URINALYSIS DIP STICK AUTO W/O MICRO
Bilirubin, Urine, POC: NEGATIVE
Blood (UA POC): NEGATIVE
Glucose, Urine, POC: NEGATIVE
Nitrite, Urine, POC: NEGATIVE
Protein, Urine, POC: NEGATIVE
Specific Gravity, Urine, POC: 1.015 (ref 1.001–1.035)
Urobilinogen, POC: 0.2
pH, Urine, POC: 7 (ref 4.6–8.0)

## 2022-12-06 MED ORDER — NITROFURANTOIN MONOHYD MACRO 100 MG PO CAPS
100 MG | ORAL_CAPSULE | Freq: Two times a day (BID) | ORAL | 0 refills | Status: AC
Start: 2022-12-06 — End: 2022-12-11

## 2022-12-06 NOTE — Progress Notes (Signed)
Verbal consent obtained, 21g needle to RAC, site cleansed antiseptically, 6 sst obtained, pressure dressing applied to site, pt tolerated procedure well, specimen sent to lab for processing

## 2022-12-06 NOTE — Patient Instructions (Addendum)
Discussed with patient options for evaluation/treatment today.  Urinalysis showed small leukocyte esterase.  Sending urine culture out for confirmation.  In the meantime, starting patient on course of Macrobid for 5 days.  I suspect the white blood cell issue may be related to the penile discharge, and not a true UTI, but we will know that till the culture comes back.  STD testing with urine and blood work performed.  Advised patient will take several days for all of this to come back.  He has been advised that treatment for gonorrhea involves returning to the office for shot of antibiotics.  No work note needed.    You will receive a follow up survey after your visit, usually by e-mail, or by text link.    Please take the time to tell us what you thought of your visit - what we did well...any staff that made your day! Or ways that we can improve!   We also welcome [employer] reviews!  Thanks for helping [place] be the best [facility] possible!

## 2022-12-06 NOTE — Progress Notes (Signed)
Chief Complaint  Chief Complaint   Patient presents with    Exposure to STD     Unprotected sex in may . Admits to discharge from penis and painful urination.         Subjective     HPI:  Patrick Kaufman with (DOB:  08-25-1985) is a 37 y.o. male, here for evaluation of the following chief complaint(s):  Exposure to STD (Unprotected sex in may . Admits to discharge from penis and painful urination. )    Painful urination x 1 week. 3 days of penile discharge.   Unprotected sexual encounter in May - has had no partner notification about any particular illness.  No pain between scrotum and rectum.  Denies any testicular pain, any scrotal swelling.  No fever, chills, nausea or vomiting.        History provided by:  Patient  Language interpreter used: No         No Known Allergies  History reviewed. No pertinent past medical history.  History reviewed. No pertinent surgical history.  Current Medications:  Current Outpatient Medications   Medication Sig Dispense Refill    finasteride (PROSCAR) 5 MG tablet Take 1 tablet by mouth daily      nitrofurantoin, macrocrystal-monohydrate, (MACROBID) 100 MG capsule Take 1 capsule by mouth 2 times daily for 5 days 10 capsule 0    levothyroxine (SYNTHROID) 100 MCG tablet 1 tablet in the morning on an empty stomach Orally Once a day for 30 day(s) (Patient not taking: Reported on 12/06/2022)       No current facility-administered medications for this visit.        Review of System:  Review of Systems    All other systems negative other than noted in HPI         Objective     BP (!) 156/97   Pulse (!) 107   Temp 98.4 F (36.9 C)   Resp 18   Wt 108.9 kg (240 lb)   SpO2 99%     Physical Exam:  Physical Exam  Vitals reviewed.   Constitutional:       Appearance: Normal appearance.   HENT:      Head: Normocephalic and atraumatic.      Mouth/Throat:      Mouth: Mucous membranes are moist.   Eyes:      Conjunctiva/sclera: Conjunctivae normal.   Pulmonary:      Effort: Pulmonary effort is  normal. No respiratory distress.   Genitourinary:     Comments: Exam deferred.  Skin:     General: Skin is warm and dry.   Neurological:      General: No focal deficit present.      Mental Status: He is alert.      Cranial Nerves: No cranial nerve deficit.               Assessment & Plan   ASSESSMENT/PLAN:    ICD-10-CM    1. Dysuria  R30.0 AMB POC URINALYSIS DIP STICK AUTO W/O MICRO     Culture, Urine      2. Exposure to sexually transmitted disease (STD)  Z20.2 HIV-1/2 Combo Antigen/Antibody By [facility] Reflex Panel     Hepatitis C Ab, Rflx To Qt By Pcr     Hepatitis B Core Ab W/Reflex ([facility] DEFAULT)     HSV 1 and 2 Specific Ab, IgG     RPR Titer     Hepatitis B Surface Antigen  3. Penile discharge  R36.9 Chlamydia, Gonorrhea, Trichomoniasis      4. Leukocytes in urine  R82.998 nitrofurantoin, macrocrystal-monohydrate, (MACROBID) 100 MG capsule        1. Dysuria  -     AMB POC URINALYSIS DIP STICK AUTO W/O MICRO  -     Culture, Urine; Future  2. Exposure to sexually transmitted disease (STD)  -     HIV-1/2 Combo Antigen/Antibody By [facility] Reflex Panel; Future  -     Hepatitis C Ab, Rflx To Qt By Pcr; Future  -     Hepatitis B Core Ab W/Reflex ([facility] DEFAULT); Future  -     HSV 1 and 2 Specific Ab, IgG; Future  -     RPR Titer; Future  -     Hepatitis B Surface Antigen; Future  3. Penile discharge  -     Chlamydia, Gonorrhea, Trichomoniasis; Future  4. Leukocytes in urine  -     nitrofurantoin, macrocrystal-monohydrate, (MACROBID) 100 MG capsule; Take 1 capsule by mouth 2 times daily for 5 days, Disp-10 capsule, R-0Normal    Allergies reviewed with patient for accuracy, and any errors were corrected.   Results for orders placed or performed in visit on 12/06/22   AMB POC URINALYSIS DIP STICK AUTO W/O MICRO   Result Value Ref Range    Color (UA POC) Yellow     Clarity (UA POC) Clear     Glucose, Urine, POC Negative     Bilirubin, Urine, POC Negative     Ketones, Urine, POC Small     Specific Gravity, Urine, POC  1.015 1.001 - 1.035    Blood (UA POC) Negative     pH, Urine, POC 7.0 4.6 - 8.0    Protein, Urine, POC Negative     Urobilinogen, POC 0.2 mg/dL     Nitrite, Urine, POC Negative     Leukocyte Esterase, Urine, POC Small          Patient Instructions   Discussed with patient options for evaluation/treatment today.  Urinalysis showed small leukocyte esterase.  Sending urine culture out for confirmation.  In the meantime, starting patient on course of Macrobid for 5 days.  I suspect the white blood cell issue may be related to the penile discharge, and not a true UTI, but we will know that till the culture comes back.  STD testing with urine and blood work performed.  Advised patient will take several days for all of this to come back.  He has been advised that treatment for gonorrhea involves returning to the office for shot of antibiotics.  No work note needed.    You will receive a follow up survey after your visit, usually by e-mail, or by text link.    Please take the time to tell us what you thought of your visit - what we did well...any staff that made your day! Or ways that we can improve!   We also welcome [employer] reviews!  Thanks for helping [place] be the best [facility] possible!       Return in about 4 days (around 12/10/2022) for Lab results, by phone.       An electronic signature was used to authenticate this note.    --Verlee Rossetti, MD

## 2022-12-07 LAB — C.TRACHOMATIS N.GONORRHOEAE T. VAGINALI, MOLECULAR, URINE
Chlamydia Trachomatis, NAA, Urine: NEGATIVE
Neisseria gonorrhoeae, NAA, Urine: NEGATIVE
Trichomonas vaginalis DNA, Urine: NEGATIVE

## 2022-12-07 LAB — CULTURE, URINE: FINAL REPORT: NO GROWTH

## 2022-12-07 LAB — HCV QN INTERP

## 2022-12-07 LAB — HEPATITIS C AB, RFLX TO QT BY PCR: Hepatitis C Ab: NONREACTIVE

## 2022-12-07 LAB — HIV-1/2 COMBO ANTIGEN/ANTIBODY BY CIA REFLEX PANEL: HIV AG/AB, 4TH GEN: NONREACTIVE

## 2022-12-07 LAB — HEPATITIS B SURFACE ANTIGEN: Hepatitis B Surface Ag: NEGATIVE

## 2022-12-07 LAB — RPR TITER: RPR Quantitative: NONREACTIVE titer

## 2022-12-07 LAB — HEPATITIS B CORE AB W/REFLEX: Hep B Core Total Ab: NEGATIVE

## 2022-12-08 ENCOUNTER — Encounter
Admit: 2022-12-08 | Discharge: 2022-12-08 | Payer: PRIVATE HEALTH INSURANCE | Attending: Family | Primary: Diagnostic Radiology

## 2022-12-08 ENCOUNTER — Encounter

## 2022-12-08 DIAGNOSIS — R369 Urethral discharge, unspecified: Secondary | ICD-10-CM

## 2022-12-08 LAB — HSV 1 AND 2 SPECIFIC ANTIBODY, IGG
HSV 1 IgG Type Spec: <0.91 Index (ref 0.00–0.90)
HSV II IgG,Type Spec: <0.91 Index (ref 0.00–0.90)

## 2022-12-08 NOTE — Telephone Encounter (Signed)
Left a message for the patient to give the nurse a call back to discuss test results

## 2022-12-08 NOTE — Progress Notes (Signed)
Patrick Kaufman (DOB:  1985/10/15) is a 37 y.o. male,Established patient, here for evaluation of the following chief complaint(s):  Follow-up      Assessment & Plan   1. Penile discharge  -     Crisoforo Oxford, MD - Urology  Encouraging that symptoms are resolving. Will complete antibiotics.   We discussed PCP or urologist referral if symptoms persist.     Precautions were given regarding new or worsening problems, continue any maintenance medications and follow up with PCP.      Return if symptoms worsen or fail to improve.       Subjective   12/08/22  Please see below.   For follow up on recent issues with dysuria, penile discharge. Was started on macrobid from [facility] 2 days ago and symptoms are improving. So far STD screening has been negative. Denies any new symptoms.       12/06/22  Patrick Kaufman with (DOB:  Nov 27, 1985) is a 37 y.o. male, here for evaluation of the following chief complaint(s):  Exposure to STD (Unprotected sex in may . Admits to discharge from penis and painful urination. )     Painful urination x 1 week. 3 days of penile discharge.   Unprotected sexual encounter in May - has had no partner notification about any particular illness.  No pain between scrotum and rectum.  Denies any testicular pain, any scrotal swelling.  No fever, chills, nausea or vomiting.          Review of Systems   All other systems reviewed and are negative.         Objective   Physical Exam     GENERAL APPEARANCE: well developed, well nourished, in no acute distress.   HEAD: normocephalic, atraumatic.   EYES: Pupils equal and round, conjuctiva clear, lids normal.   EARS: Externally grossly normal. Hearing grossly intact.   NOSE: no external lesions.   NECK: neck supple, normal active ROM exhibited.   LYMPH NODES: no palpable adenopathy.   LUNGS: Breathing comfortably, no audible wheezes on expiration.   CHEST: normal movement  EXTREMITIES: Appears well perfused.Marland Kitchen   NEUROLOGIC: CN grossly intact., cognitive exam  grossly normal.   Previous results reviewed and discussed.            An electronic signature was used to authenticate this note.    --Dorthula Matas, APRN - NP

## 2022-12-08 NOTE — Telephone Encounter (Signed)
Spoke with pt and relayed above instructions from provider. Pt verbalized understanding .

## 2022-12-08 NOTE — Progress Notes (Unsigned)
Patrick Kaufman (DOB:  [birth year]) is a 37 y.o. male,Established patient, here for evaluation of the following chief complaint(s):  No chief complaint on file.      Assessment & Plan   1. Penile discharge  -     Crisoforo Oxford, MD - Urology      No follow-ups on file.       Subjective   HPI    Review of Systems       Objective   Physical Exam       {Time Documentation Optional:[id number]}      An electronic signature was used to authenticate this note.    --Dorthula Matas, APRN - NP

## 2022-12-11 NOTE — Other (Signed)
Please advise patient that his hepatitis C test and his STD testing all came back negative.

## 2023-05-09 ENCOUNTER — Encounter: Payer: Self-pay | Admitting: Psychiatry
